# Patient Record
Sex: Male | Born: 1945 | Race: Black or African American | Hispanic: No | Marital: Single | Smoking: Former smoker
Health system: Southern US, Community
[De-identification: ages and names within clinical notes are randomized; demographics above are authoritative.]

## PROBLEM LIST (undated history)

## (undated) DIAGNOSIS — I10 Essential (primary) hypertension: Secondary | ICD-10-CM

## (undated) DIAGNOSIS — I639 Cerebral infarction, unspecified: Secondary | ICD-10-CM

## (undated) DIAGNOSIS — B159 Hepatitis A without hepatic coma: Secondary | ICD-10-CM

## (undated) DIAGNOSIS — C801 Malignant (primary) neoplasm, unspecified: Secondary | ICD-10-CM

## (undated) HISTORY — PX: HERNIA REPAIR: SHX51

---

## 2012-05-15 DIAGNOSIS — G459 Transient cerebral ischemic attack, unspecified: Secondary | ICD-10-CM

## 2012-05-23 ENCOUNTER — Inpatient Hospital Stay (HOSPITAL_COMMUNITY): Admit: 2012-05-23 | Payer: No Typology Code available for payment source | Admitting: Physical Medicine & Rehabilitation

## 2012-05-23 ENCOUNTER — Encounter: Payer: Self-pay | Admitting: *Deleted

## 2012-05-23 ENCOUNTER — Inpatient Hospital Stay (HOSPITAL_COMMUNITY)
Admission: AD | Admit: 2012-05-23 | Discharge: 2012-06-06 | DRG: 945 | Disposition: A | Discharge status: Home Health | Payer: Medicare Other | Source: Other Acute Inpatient Hospital | Attending: Physical Medicine & Rehabilitation | Admitting: Physical Medicine & Rehabilitation

## 2012-05-23 ENCOUNTER — Encounter (HOSPITAL_COMMUNITY): Payer: Self-pay | Admitting: General Practice

## 2012-05-23 DIAGNOSIS — Z5189 Encounter for other specified aftercare: Principal | ICD-10-CM

## 2012-05-23 DIAGNOSIS — Z87891 Personal history of nicotine dependence: Secondary | ICD-10-CM

## 2012-05-23 DIAGNOSIS — Z8673 Personal history of transient ischemic attack (TIA), and cerebral infarction without residual deficits: Secondary | ICD-10-CM

## 2012-05-23 DIAGNOSIS — E785 Hyperlipidemia, unspecified: Secondary | ICD-10-CM | POA: Diagnosis present

## 2012-05-23 DIAGNOSIS — I6330 Cerebral infarction due to thrombosis of unspecified cerebral artery: Secondary | ICD-10-CM | POA: Diagnosis present

## 2012-05-23 DIAGNOSIS — R059 Cough, unspecified: Secondary | ICD-10-CM | POA: Diagnosis present

## 2012-05-23 DIAGNOSIS — R05 Cough: Secondary | ICD-10-CM | POA: Diagnosis present

## 2012-05-23 DIAGNOSIS — I639 Cerebral infarction, unspecified: Secondary | ICD-10-CM

## 2012-05-23 DIAGNOSIS — L299 Pruritus, unspecified: Secondary | ICD-10-CM | POA: Diagnosis present

## 2012-05-23 DIAGNOSIS — K5900 Constipation, unspecified: Secondary | ICD-10-CM | POA: Diagnosis present

## 2012-05-23 DIAGNOSIS — I10 Essential (primary) hypertension: Secondary | ICD-10-CM

## 2012-05-23 DIAGNOSIS — G8190 Hemiplegia, unspecified affecting unspecified side: Secondary | ICD-10-CM | POA: Diagnosis present

## 2012-05-23 HISTORY — DX: Hepatitis a without hepatic coma: B15.9

## 2012-05-23 HISTORY — DX: Essential (primary) hypertension: I10

## 2012-05-23 HISTORY — DX: Cerebral infarction, unspecified: I63.9

## 2012-05-23 HISTORY — DX: Malignant (primary) neoplasm, unspecified: C80.1

## 2012-05-23 MED ORDER — CLOPIDOGREL BISULFATE 75 MG PO TABS
75.0000 mg | ORAL_TABLET | Freq: Every day | ORAL | Status: DC
  Administered 2012-05-24 – 2012-06-06 (×14): 75 mg via ORAL
  Filled 2012-05-23 (×16): qty 1

## 2012-05-23 MED ORDER — GUAIFENESIN-DM 100-10 MG/5ML PO SYRP: 5.0000 mL | ORAL_SOLUTION | Freq: Four times a day (QID) | ORAL | Status: DC | PRN

## 2012-05-23 MED ORDER — SIMVASTATIN 20 MG PO TABS
20.0000 mg | ORAL_TABLET | Freq: Every day | ORAL | Status: DC
  Administered 2012-05-23 – 2012-06-05 (×14): 20 mg via ORAL
  Filled 2012-05-23 (×15): qty 1

## 2012-05-23 MED ORDER — ENOXAPARIN SODIUM 40 MG/0.4ML ~~LOC~~ SOLN
40.0000 mg | Freq: Every day | SUBCUTANEOUS | Status: DC
  Administered 2012-05-23 – 2012-06-06 (×15): 40 mg via SUBCUTANEOUS
  Filled 2012-05-23 (×18): qty 0.4

## 2012-05-23 MED ORDER — LOSARTAN POTASSIUM 50 MG PO TABS
50.0000 mg | ORAL_TABLET | Freq: Every day | ORAL | Status: DC
  Administered 2012-05-24 – 2012-06-01 (×9): 50 mg via ORAL
  Filled 2012-05-23 (×12): qty 1

## 2012-05-23 MED ORDER — PROCHLORPERAZINE MALEATE 5 MG PO TABS
5.0000 mg | ORAL_TABLET | Freq: Four times a day (QID) | ORAL | Status: DC | PRN
  Filled 2012-05-23: qty 2

## 2012-05-23 MED ORDER — FLEET ENEMA 7-19 GM/118ML RE ENEM: 1.0000 | ENEMA | Freq: Once | RECTAL | Status: AC | PRN

## 2012-05-23 MED ORDER — POLYETHYLENE GLYCOL 3350 17 G PO PACK
17.0000 g | PACK | Freq: Every day | ORAL | Status: DC | PRN
  Administered 2012-05-24 – 2012-05-25 (×2): 17 g via ORAL
  Filled 2012-05-23 (×2): qty 1

## 2012-05-23 MED ORDER — PROCHLORPERAZINE 25 MG RE SUPP
12.5000 mg | Freq: Four times a day (QID) | RECTAL | Status: DC | PRN
  Filled 2012-05-23: qty 1

## 2012-05-23 MED ORDER — BISACODYL 10 MG RE SUPP
10.0000 mg | Freq: Every day | RECTAL | Status: DC | PRN
  Administered 2012-05-26: 10 mg via RECTAL
  Filled 2012-05-23 (×4): qty 1

## 2012-05-23 MED ORDER — TRAZODONE HCL 50 MG PO TABS: 25.0000 mg | ORAL_TABLET | Freq: Every evening | ORAL | Status: DC | PRN

## 2012-05-23 MED ORDER — ACETAMINOPHEN 325 MG PO TABS
325.0000 mg | ORAL_TABLET | ORAL | Status: DC | PRN
  Administered 2012-06-02: 650 mg via ORAL
  Filled 2012-05-23: qty 2

## 2012-05-23 MED ORDER — HYDROCHLOROTHIAZIDE 12.5 MG PO CAPS
12.5000 mg | ORAL_CAPSULE | Freq: Every day | ORAL | Status: DC
  Administered 2012-05-24 – 2012-06-06 (×14): 12.5 mg via ORAL
  Filled 2012-05-23 (×17): qty 1

## 2012-05-23 MED ORDER — PROCHLORPERAZINE EDISYLATE 5 MG/ML IJ SOLN
5.0000 mg | Freq: Four times a day (QID) | INTRAMUSCULAR | Status: DC | PRN
  Filled 2012-05-23: qty 2

## 2012-05-23 MED ORDER — DIPHENHYDRAMINE HCL 12.5 MG/5ML PO ELIX
12.5000 mg | ORAL_SOLUTION | Freq: Four times a day (QID) | ORAL | Status: DC | PRN
  Filled 2012-05-23: qty 10

## 2012-05-23 MED ORDER — ALUM & MAG HYDROXIDE-SIMETH 200-200-20 MG/5ML PO SUSP: 30.0000 mL | ORAL | Status: DC | PRN

## 2012-05-23 NOTE — H&P (Signed)
Physical Medicine and Rehabilitation Admission H&P  HPI: Dustin West is an 67 y.o. RH-male with history of HTN, TIA event 05/14/12, who was readmitted to [facility] on 05/20/12 with left sided weakness and numbness. Echo done on prior visit with EF of 60-65% with mild LVH. 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  MRA head and neck without stenosis. He reported cough due to lisinopril and was changed over to cozaar. 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 Patient continues to be impaired in ability to carry out ADLS as well as deficits in balance. Therapy team working on pregait activities and recommended intensive therapies.  Patient admitted for CIR today.     Review of Systems  HENT: Negative for hearing loss.   Eyes: Negative for blurred vision and double vision.  Respiratory: Positive for cough (dry).        Post nasal drip.  Cardiovascular: Negative for chest pain and palpitations.  Gastrointestinal: Positive for constipation. Negative for heartburn, nausea, vomiting and abdominal pain.  Genitourinary: Negative for urgency and frequency.  Musculoskeletal: Negative for myalgias, back pain and joint pain.  Skin: Positive for itching.  Neurological: Positive for sensory change, focal weakness and headaches (due to cold). Negative for speech change.  Psychiatric/Behavioral: The patient does not have insomnia.    Past Medical History  Diagnosis Date  . Hypertension     diagnosed 05/14/12  . Stroke   . Cancer   . Hepatitis A    Past Surgical History  Procedure Date  . Hernia repair     Family History  Problem Relation Age of Onset  . Cancer Father   . Cancer Brother     Social History:  Lives alone. Used to work as a Music therapist for [employer]. He  reports that she quit smoking about 15 years ago. He does not have any smokeless tobacco history on file. He has history of alcohol abuse--quit 20 years ago due "to problems with bones and liver". He does not use  any drugs.   Allergies:  Allergies  Allergen Reactions  . Lisinopril Cough    No prescriptions prior to admission    Home: Lives in 2 level duplex with 4 STE and 12 steps to bedroom.    Functional History: Independent PTA.  Functional Status:  Mobility: Min assist for sitting balance. Min assist transfers with verbal cues. Able to stand 1-2 mins with mild posterior lean     ADL: Min assist for feeding past set up. Max assist for UB/LB ADLs.  Cognition: Cognition Orientation Level: Oriented X4     Labs: Na: 137     K:3.7       Cl:97      CO2: 26     BUN:20     Cr: 1.26   TB: 1.1    Alb: 4.0 H/H:: 14.6/46.0      WBC: 5.5       Plt: 239  Blood pressure 116/69, pulse 67, temperature 97.9 F (36.6 C), temperature source Oral, resp. rate 16, height 5\' 6"  (1.676 m), weight 66.089 kg (145 lb 11.2 oz), SpO2 96.00%. Physical Exam  Nursing note and vitals reviewed. Constitutional: She is oriented to person, place, and time. She appears well-developed and well-nourished.  HENT:  Head: Normocephalic and atraumatic.  Right Ear: External ear normal.  Left Ear: External ear normal.  Nose: Nose normal.  Mouth/Throat: Oropharynx is clear and moist.  Eyes: Conjunctivae normal and EOM are normal. Pupils are equal,  round, and reactive to light.  Neck: Normal range of motion. Neck supple. No JVD present. No tracheal deviation present. No thyromegaly present.  Cardiovascular: Normal rate, regular rhythm and normal heart sounds.  Exam reveals no gallop and no friction rub.   No murmur heard. Respiratory: Breath sounds normal. She is in respiratory distress. She has no wheezes. She has no rales. She exhibits no tenderness.  GI: Soft. Bowel sounds are normal. She exhibits no distension. There is no tenderness.  Musculoskeletal: Normal range of motion. She exhibits no edema.  Lymphadenopathy:    She has no cervical adenopathy.  Neurological: She is alert and oriented to person, place,  and time.       Stuttering Speech--low volume. Follows basic commands without difficulty. Mild left facial weakness. 1/2 sensation left face, arm, leg. Left deltoid, bicep, tricep--tr to 1+, absent wrist and hand, HF 2+, KE and KF - 3, absent ADF and APF. DTR's 2+ right, trace on left. cognitvely he's intact.   Skin: Skin is warm and dry.  Psychiatric: She has a normal mood and affect. Her behavior is normal. Judgment and thought content normal.    No results found for this or any previous visit (from the past 48 hour(s)). No results found.  Assessment/Plan: 1. Functional deficits secondary  to thrombotic right pontine infarct. 2. Patient admitted to receive collaborative, interdisciplinary care between the psychiatrist, rehab nursing staff, and therapy team. 3. Patient's level of medical complexity and substantial therapy needs in context of that medical necessity cannot be provided at a lesser intensity of care. 4. Patient has experienced substantial functional loss form his/her baseline. Judging by the patient's diagnosis, physical exam, and functional history, the patient has the potential for functional progress OR the patient as displayed the ability to make functional progress which will result in measurable gains while on inpatient rehab.  These gains will be of substantial and practical use upon discharge in facilitating mobility and self-care at the household level. 5. Psychiatrist will provide 24 hour management of medical needs as well as oversight of the therapy plan/treatment and provide guidance as appropriate regarding the interaction of the two. 6. 24 hour rehab nursing will assist in the management of bowel and bladder fxn, nutrition, skin care, education, and help integrate therapy concepts, techniques,education, etc. 7. PT will assess and treat ZOX:WRUEA extremity strength, range of motion, stamina, balance, functional mobility, safety, adaptive techniques and equipment, NMR,  education, orthotic use  .  Goals are supervision to min assist. 8. OT will assess and treat for ADL's, functional mobility, safety, upper extremity strength, adaptive techniques and equipment, NMR, education, orthotics  .  Goals are: min assist .  9. SLP will assess and treat for n/a .  Goals are: n/a. 10. Case Management and Social Worker will assess and treat for psychological issues and discharge planning. 11. Team conference will be held weekly to establish goals to assess progress and todetermine barriers to discharge. 12.  Patient will receive at least 3 hours of therapy per day at least 5 days per week. 13. ELOS and Prognosis: 2wks and good. Pt is quite motivated    Medical Problem List and Plan: 1. DVT Prophylaxis/Anticoagulation: Pharmaceutical: Lovenox 2. Pain Management: N/A 3. Mood:  No signs of distress noted. Will monitor for now. LCSW to follow up for formal evaluation.  4. HTN: Recent diagnosis. Monitor with bid checks. Continue cozaar and monitor for improvement in cough. Check renal status on Monday. Education regarding BP control in  the context of stroke secondary prevention. 5. Dyslipidemia: continue zocor for management.    Ivory Broad, MD 05/23/12

## 2012-05-23 NOTE — Progress Notes (Signed)
Patient admitted from OSH. Patient is alert and oriented, VSS.  Bed in low position, call bell within reach. Patient oriented to room and unit. Safety agreement signed by patient and nurse.  Rehab specific safety plan (pink sheet) posted in room.  Patient given Health resource notebook.

## 2012-05-23 NOTE — PMR Pre-admission (Signed)
[facility] PMR Admission Coordinator Pre-Admission Assessment  Patient: Dustin West is an 67 y.o., male MRN: [id number] DOB: 01-03-1946 Height: 5\' 6"  (167.6 cm) Weight: 70.761 kg (156 lb)  Insurance Information HMO:No   PPO:       PCP:       IPA:       80/20:       OTHER:   PRIMARY: Medicare A/B      Policy#: [id number]      Subscriber: Dina Rich CM Name:        Phone#:       Fax#:   Pre-Cert#:        Employer: Retired Benefits:  Phone #:       Name: Armed forces technical officer. Date: 07/19/00     Deduct: $1216      Out of Pocket Max: none      Life Max: unlimited CIR: 100%      SNF: 100 days Outpatient: 80%     Co-Pay: 20% Home Health: 100%      Co-Pay: 80% DME: 80%     Co-Pay: 20% Providers: patient's choice  Emergency Contact Information Contact Information    Name Relation Home Work Mobile   Zigler,Conswella Daughter [phone]        Current Medical History  Patient Admitting Diagnosis: R Pons CVA with L hemiparesis  History of Present Illness:  Was seen on Christmas day and discharged with a diagnosis of hypertension and TIA.  Started experiencing weakness on the left side night of 12/30.  Woke up 12/31 with almost total paralysis of the left side.  MRI showed acute infarction on the right side of the pons.  Extensive chronic small vessel changes throughout the cerebral hemisphere.  [facility]  Past Medical History  Colon CA Hepatitis A Hypertension TIA 05/14/12 Inguinal Hernia Repair Colon surgery  Family History  Significant for colon cancer  Prior Rehab/Hospitalizations: No previous rehab admissions.   Current Medications Plavix, Lasartan, HCTZ, Pravachol  Patients Current Diet:  Cardiac diet  Precautions / Restrictions Precautions Precautions: Fall Precautions/Special Needs: Behavior Precaution Comments: Trouble with moter planning.  Impaired coordination.  Decreased endurance and balance.  Mild impulsivity.  Is  motivated and wants to go home. Restrictions Weight Bearing Restrictions: No   Prior Activity Level Community (5-7x/wk): Went out daily.  Walks to daughters house daily around the corner.  Home Assistive Devices / Equipment Home Assistive Devices/Equipment: None   Prior Functional Level Current Functional Level  Bed Mobility  Independent  Min assist   Transfers  Independent  Mod assist (Used hemi walker for transfers.)   Mobility - Walk/Wheelchair  Independent  Other (Stood for 2 minutes.  Has a posterior lean.)   Upper Body Dressing  Independent  Max assist   Lower Body Dressing  Independent  Max assist   Grooming  Independent  Min assist   Eating/Drinking  Independent  Min assist   Toilet Transfer  Independent  Max assist   Bladder Continence   WNL      Bowel Management  Tends toward constipation  Constipation   Stair Climbing  Able to Take Stairs?: Yes  Other (Not tried.)   Communication  WNL  Speech is difficult to understand   Memory  WNL  mild impairment   Cooking/Meal Prep  I      Housework  I    Money Management  I    Driving  No     Previous Home Environment  Living Arrangements: Alone Lives With: Alone Available Help at Discharge: Family Type of Home: Apartment Home Layout: Two level;1/2 bath on main level Alternate Level Stairs-Number of Steps: 12 Home Access: Stairs to enter [employer] of Steps: 4  Discharge Living Setting Plans for Discharge Living Setting: House;Lives with (comment) (Can go home with daughter.) Type of Home at Discharge: Apartment Discharge Home Layout: Two level;Able to live on main level with bedroom/bathroom Alternate Level Stairs-Number of Steps: Flight of stairs Discharge Home Access: Stairs to enter [employer] of Steps: 2 steps to porch.  Social/Family/Support Systems Patient Roles: Parent (Has 2 daughters and a son.) Contact Information: Jake Samples - dtr  (c) [phone] Anticipated Caregiver: Dtr and son Tyronn Golda - son [phone]) Anticipated Caregiver's Contact Information: Dtr, Rema Jasmine is primary contact. Ability/Limitations of Caregiver: Patient has lived with dtr previously.  Dtr can assist.  Son can also assist. Caregiver Availability: Other (Comment) (Family to work on 24 hr supervision.) Discharge Plan Discussed with Primary Caregiver: Yes Is Caregiver In Agreement with Plan?: Yes Does Caregiver/Family have Issues with Lodging/Transportation while Pt is in Rehab?: No  Goals/Additional Needs Patient/Family Goal for Rehab: PT/OT S/min A goals, ST needs TBD Expected length of stay: 10-14 days Cultural Considerations: None Dietary Needs: Cardiac diet Equipment Needs: TBD Pt/Family Agrees to Admission and willing to participate: Yes Program Orientation Provided & Reviewed with Pt/Caregiver Including Roles  & Responsibilities: Yes  Patient Condition:  I have seen and evaluated this patient.  I have shared all information with rehab MD.  I have talked with daughter as well as the patient.  Patient is very motivated and has good potential for recovery.  Patient can tolerate and benefit from a comprehensive inpatient rehab program.  Preadmission Screen Completed By:  Trish Mage, 05/23/2012 9:35 AM ______________________________________________________________________   Discussed status with Dr. Riley Kill on 05/23/12 at [phone] and received telephone approval for admission today.  Admission Coordinator:  Trish Mage, time 0955/Date 05/23/12

## 2012-05-23 NOTE — Plan of Care (Signed)
Overall Plan of Care [facility]) Patient Details Name: DIOGO ANNE MRN: [id number] DOB: 01/14/1946  Diagnosis:  CVA  Primary Diagnosis:    <principal problem not specified> Co-morbidities: htn, cancer, hepatitis A  Functional Problem List  Patient demonstrates impairments in the following areas: Balance, Medication Management, Motor, Pain, Sensory  and Skin Integrity  Basic ADL's: eating, grooming, bathing, dressing and toileting Advanced ADL's: simple meal preparation  Transfers:  bed mobility, bed to chair, toilet, tub/shower and car Locomotion:  ambulation and stairs  Additional Impairments:  Communication  expression  Anticipated Outcomes Item Anticipated Outcome  Eating/Swallowing    Basic self-care  Supervision  Tolieting  supervision  Bowel/Bladder  Continent of bowel & bladder  Transfers  S/Mod-I  Locomotion  150' w/ LRAD x 150' S/Mod-I Stairs x 12 with min-A  Communication  Mod I   Cognition    Pain  Patient will rate pain <3 on pain scale of 0-10  Safety/Judgment  Patient will use call bell to call for assistance/notify nurse. Patient will remain free from falls during admission  Other  Skin will remain intact during admission   Therapy Plan: PT Intensity: Minimum of 1-2 x/day ,45 to 90 minutes PT Duration Estimated Length of Stay: 10-14 days OT Intensity: Minumum of 2-3x/day, 45 to 90 minutes OT Frequency: 5 out of 7 days OT Duration/Estimated Length of Stay: 2 weeks SLP Intensity: Minumum of 1-2 x/day, 30 to 90 minutes SLP Frequency: 3 out of 7 days SLP Duration/Estimated Length of Stay: 1-2 weeks for SLP    Team Interventions: Item RN PT OT SLP SW TR Other  Self Care/Advanced ADL Retraining   x      Neuromuscular Re-Education  x x      Therapeutic Activities  x x x     UE/LE Strength Training/ROM  x xx      UE/LE Coordination Activities  x       Visual/Perceptual Remediation/Compensation         DME/Adaptive Equipment Instruction  x x       Therapeutic Exercise  x xxx x     Balance/Vestibular Training  x x      Patient/Family Education  x x x     Cognitive Remediation/Compensation         Functional Mobility Training  x x      Ambulation/Gait Training  x       Museum/gallery curator  x       Wheelchair Propulsion/Positioning  x       Surveyor, mining Facilitation    x     Bladder Management         Bowel Management         Disease Management/Prevention         Pain Management X        Medication Management X        Skin Care/Wound Management X        Splinting/Orthotics  x       Discharge Planning  x       Psychosocial Support X                               Team Discharge Planning: Destination: PT-Home ,OT-   , SLP-  Projected Follow-up: PT-Home health PT, OT-   , SLP-  Projected Equipment Needs: PT TBD RW, w/c and cushion- , OT-  , SLP-  Patient/family involved in discharge planning: PT- Patient,  OT- , SLP-   MD ELOS: 10-14 days Medical Rehab Prognosis:  Excellent Assessment: The patient has been admitted for CIR therapies. The team will be addressing, functional mobility, strength, stamina, balance, safety, adaptive techniques/equipment, self-care, bowel and bladder mgt, patient and caregiver education, NMR, orthotics, stroke education. Goals have been set at supervision to mod I.     See Team Conference Notes for weekly updates to the plan of care

## 2012-05-24 ENCOUNTER — Inpatient Hospital Stay (HOSPITAL_COMMUNITY): Payer: Medicare Other | Admitting: *Deleted

## 2012-05-24 ENCOUNTER — Inpatient Hospital Stay (HOSPITAL_COMMUNITY): Payer: Medicare Other | Admitting: Physical Therapy

## 2012-05-24 ENCOUNTER — Inpatient Hospital Stay (HOSPITAL_COMMUNITY): Payer: Medicare Other | Admitting: Speech Pathology

## 2012-05-24 DIAGNOSIS — I6330 Cerebral infarction due to thrombosis of unspecified cerebral artery: Secondary | ICD-10-CM

## 2012-05-24 NOTE — Evaluation (Signed)
Occupational Therapy Assessment and Plan  Patient Details  Name: Dustin West MRN: [id number] Date of Birth: 1946/03/30  OT Diagnosis: hemiplegia affecting non-dominant side Rehab Potential: Rehab Potential: Excellent ELOS: 2 weeks   Today's Date: 05/24/2012 Time:  - 0800-0900  (60 min)       Problem List: There is no problem list on file for this patient.   Past Medical History:  Past Medical History  Diagnosis Date  . Hypertension     diagnosed 05/14/12  . Stroke   . Cancer   . Hepatitis A    Past Surgical History:  Past Surgical History  Procedure Date  . Hernia repair     Assessment & Plan Clinical Impression:Dustin West is an 67 y.o. RH-male with history of HTN, TIA event 05/14/12, who was readmitted to [facility] on 05/20/12 with left sided weakness and numbness. Echo done on prior visit with EF of 60-65% with mild LVH. 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 MRA head and neck without stenosis. He reported cough due to lisinopril and was changed over to cozaar. 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 Patient continues to be impaired in ability to carry out ADLS as well as deficits in balance. Therapy team working on pregait activities and recommended intensive therapies. Patient admitted for CIR today.  Patient transferred to CIR on 05/23/2012 .    Patient currently requires min with basic self-care skills secondary to abnormal tone.  Prior to hospitalization, patient could complete BADL with no assist.  Patient will benefit from skilled intervention to increase independence with basic self-care skills and increase level of independence with iADL prior to discharge home with care partner.  Anticipate patient will require intermittent supervision and follow up home health.  OT - End of Session Activity Tolerance: Tolerates 30+ min activity with multiple rests Endurance Deficit: Yes Endurance Deficit Description: goes to bed after session OT  Assessment Rehab Potential: Excellent Barriers to Discharge: None OT Plan OT Intensity: Minumum of 2-3x/day, 45 to 90 minutes OT Frequency: 5 out of 7 days OT Duration/Estimated Length of Stay: 2 weeks OT Treatment/Interventions: Balance/vestibular training;Discharge planning;DME/adaptive equipment instruction;Functional mobility training;Neuromuscular re-education;Pain management;Patient/family education;Self Care/advanced ADL retraining;Splinting/orthotics;Therapeutic Activities;Therapeutic Exercise;UE/LE Strength taining/ROM;UE/LE Coordination activities;Wheelchair propulsion/positioning  OT Evaluation Precautions/Restrictions  Precautions Precautions: Fall Precaution Comments: left knee buckles Restrictions Weight Bearing Restrictions: No General:  Pt. Stood at sink for 3-5 minutes during pericare with minimal assist.      Pain:  none   Home Living/Prior Functioning Home Living Lives With: Alone Available Help at Discharge: Family (son will come and live with him (Dustin West)) Type of Home: Apartment Home Access: Stairs to enter Secretary/administrator of Steps: 4 Entrance Stairs-Rails: None Home Layout: Two level;1/2 bath on main level Alternate Level Stairs-Number of Steps: 12 Alternate Level Stairs-Rails:  (wall on both sides) Bathroom Shower/Tub: Tub/shower unit;Curtain Bathroom Toilet: Standard Bathroom Accessibility: Yes How Accessible: Accessible via walker IADL History Homemaking Responsibilities: Yes Meal Prep Responsibility: Primary Laundry Responsibility: Primary Cleaning Responsibility: Primary Bill Paying/Finance Responsibility: Primary Shopping Responsibility: Primary Current License: No Mode of Transportation: Family;Friends Occupation: Retired Type of Occupation:  TEFL teacher, Financial risk analyst) Leisure and Hobbies:  (crossword puzzles) Prior Function Level of Independence: Independent with basic ADLs;Independent with homemaking with ambulation;Independent with  gait;Independent with transfers Able to Take Stairs?: Yes Driving: No Vocation: Retired Leisure: Hobbies-yes (Comment) Comments: crossword puzzles ADL ADL Grooming: Minimal assistance Upper Body Bathing: Moderate assistance Where Assessed-Upper Body Bathing: Sitting at sink;Wheelchair Lower Body Bathing: Moderate  assistance Where Assessed-Lower Body Bathing: Sitting at sink;Wheelchair Upper Body Dressing: Moderate assistance Where Assessed-Upper Body Dressing: Wheelchair;Sitting at sink Lower Body Dressing: Moderate assistance Where Assessed-Lower Body Dressing: Wheelchair;Sitting at sink Toilet Transfer: Moderate assistance Vision/Perception  Vision - History Baseline Vision: Bifocals Vision - Assessment Eye Alignment: Within Functional Limits Vision Assessment: Vision not tested Perception Perception: Within Functional Limits  Cognition Overall Cognitive Status: Appears within functional limits for tasks assessed Orientation Level: Oriented X4 Executive Function: Decision Making;Sequencing;Initiating;Self Correcting Decision Making: Appears intact Initiating: Appears intact Self Correcting: Appears intact Safety/Judgment: Appears intact Sensation Sensation Light Touch: Appears Intact (BUE) Coordination Gross Motor Movements are Fluid and Coordinated: No Fine Motor Movements are Fluid and Coordinated: No Coordination and Movement Description:  (LUE hemiplegis with trace movement) Motor  Motor Motor: Hemiplegia;Abnormal postural alignment and control;Abnormal tone;Primitive reflexes present (:LUE with associated reactions) Motor - Skilled Clinical Observations:  (trace extension in L elbow and sho.) Mobility  Bed Mobility Bed Mobility: Rolling Left;Left Sidelying to Sit;Supine to Sit;Sit to Supine Rolling Right: 4: Min assist Rolling Left: 4: Min assist Rolling Left Details: Tactile cues for weight shifting;Tactile cues for placement;Manual facilitation for weight  shifting Right Sidelying to Sit: 4: Min assist Left Sidelying to Sit: 4: Min assist Left Sidelying to Sit Details: Tactile cues for weight shifting;Verbal cues for technique;Manual facilitation for placement Supine to Sit: 4: Min assist Supine to Sit Details: Tactile cues for weight shifting;Verbal cues for technique;Manual facilitation for weight shifting Sit to Supine: 4: Min assist Sit to Supine - Details: Verbal cues for technique Scooting to HOB: 4: Min assist Transfers Sit to Stand: 4: Min assist Stand to Sit: 4: Min assist  Trunk/Postural Assessment  Cervical Assessment Cervical Assessment: Within Functional Limits Thoracic Assessment Thoracic Assessment: Within Functional Limits Lumbar Assessment Lumbar Assessment: Within Functional Limits Postural Control Postural Control: Deficits on evaluation Trunk Control: impaired muscle tone on left  Balance Balance Balance Assessed: Yes Dynamic Sitting Balance Dynamic Sitting - Level of Assistance: 4: Min assist Dynamic Sitting - Balance Activities: Reaching across midline;Reaching for objects;Forward lean/weight shifting;Lateral lean/weight shifting Static Standing Balance Static Standing - Level of Assistance: 5: Stand by assistance (With Right hand on hemiwalker and eyes open) Dynamic Standing Balance Dynamic Standing - Level of Assistance: 1: +1 Total assist (Left knee buckles during gait.Potential for fall w/ > tasks) Extremity/Trunk Assessment RUE Assessment RUE Assessment: Within Functional Limits LUE Assessment LUE Assessment: Exceptions to WFL LUE Tone LUE Tone: Hypotonic Hypotonic Details:  (trace extenxion in sho. and elbow)  See FIM for current functional status Refer to Care Plan for Long Term Goals  Recommendations for other services: None  Discharge Criteria: Patient will be discharged from OT if patient refuses treatment 3 consecutive times without medical reason, if treatment goals not met, if there is a  change in medical status, if patient makes no progress towards goals or if patient is discharged from hospital.  The above assessment, treatment plan, treatment alternatives and goals were discussed and mutually agreed upon: by patient  Humberto Seals 05/24/2012, 6:20 PM

## 2012-05-24 NOTE — Progress Notes (Signed)
No new complaints  HPI: Dustin West is an 67 y.o. RH-male with history of HTN, TIA event 05/14/12, who was readmitted to [facility] on 05/20/12 with left sided weakness and numbness. Echo done on prior visit with EF of 60-65% with mild LVH. 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 MRA head and neck without stenosis. He reported cough due to lisinopril and was changed over to cozaar. 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 Patient continues to be impaired in ability to carry out ADLS as well as deficits in balance. Therapy team working on pregait activities and recommended intensive therapies. Patient admitted for CIR today.  Review of Systems  HENT: Negative for hearing loss.  Eyes: Negative for blurred vision and double vision.  Respiratory: Positive for cough (dry).  Post nasal drip.  Cardiovascular: Negative for chest pain and palpitations.  Gastrointestinal: Positive for constipation. Negative for heartburn, nausea, vomiting and abdominal pain.  Genitourinary: Negative for urgency and frequency.  Musculoskeletal: Negative for myalgias, back pain and joint pain.  Skin: Positive for itching.  Neurological: Positive for sensory change, focal weakness and headaches (due to cold). Negative for speech change.  Psychiatric/Behavioral: The patient does not have insomnia.  Past Medical History   Diagnosis  Date   .  Hypertension      diagnosed 05/14/12   .  Stroke    .  Cancer    .  Hepatitis A     Past Surgical History   Procedure  Date   .  Hernia repair     Family History   Problem  Relation  Age of Onset   .  Cancer  Father    .  Cancer  Brother    Social History: Lives alone. Used to work as a Music therapist for [employer]. He reports that she quit smoking about 15 years ago. He does not have any smokeless tobacco history on file. He has history of alcohol abuse--quit 20 years ago due "to problems with bones and liver". He does not use any drugs.  Allergies:    Allergies   Allergen  Reactions   .  Lisinopril  Cough    No prescriptions prior to admission   Home:  Lives in 2 level duplex with 4 STE and 12 steps to bedroom.  Functional History:  Independent PTA.  Functional Status:  Mobility:  Min assist for sitting balance.  Min assist transfers with verbal cues. Able to stand 1-2 mins with mild posterior lean  ADL:  Min assist for feeding past set up.  Max assist for UB/LB ADLs.  Cognition:  Cognition  Orientation Level: Oriented X4  Labs:  Na: 137 K:3.7 Cl:97 CO2: 26 BUN:20 Cr: 1.26 TB: 1.1 Alb: 4.0  H/H:: 14.6/46.0 WBC: 5.5 Plt: 239   BP 127/58  Pulse 68  Temp 98.2 F (36.8 C) (Oral)  Resp 19  Ht 5\' 6"  (1.676 m)  Wt 145 lb 11.2 oz (66.089 kg)  BMI 23.52 kg/m2  SpO2 96%  Physical Exam  Nursing note and vitals reviewed.  Constitutional: She is oriented to person, place, and time. She appears well-developed and well-nourished. NAD HENT: moist Head: Normocephalic and atraumatic.  Right Ear: External ear normal.  Left Ear: External ear normal.  Nose: Nose normal.  Mouth/Throat: Oropharynx is clear and moist.  Eyes: Conjunctivae normal and EOM are normal. Pupils are equal, round, and reactive to light.  Neck: Normal range of motion. Neck supple. No JVD present. No tracheal deviation  present. No thyromegaly present.  Cardiovascular: Normal rate, regular rhythm and normal heart sounds. Exam reveals no gallop and no friction rub.  No murmur heard.  Respiratory: Breath sounds normal. She is in respiratory distress. She has no wheezes. She has no rales. She exhibits no tenderness.  GI: Soft. Bowel sounds are normal. She exhibits no distension. There is no tenderness.  Musculoskeletal: Normal range of motion. She exhibits no edema.  Lymphadenopathy:  She has no cervical adenopathy.  Neurological: She is alert and oriented to person, place, and time.  Stuttering Speech--low volume. Follows basic commands without difficulty. Mild  left facial weakness. 1/2 sensation left face, arm, leg. Left deltoid, bicep, tricep--tr to 1+, absent wrist and hand, HF 2+, KE and KF - 3, absent ADF and APF. DTR's 2+ right, trace on left. cognitvely he's intact.  Skin: Skin is warm and dry.  Psychiatric: She has a normal mood and affect. Her behavior is normal. Judgment and thought content normal.  No results found for this or any previous visit (from the past 48 hour(s)).  No results found.  Assessment/Plan:  1. Functional deficits secondary to thrombotic right pontine infarct. 2. Patient admitted to receive collaborative, interdisciplinary care between the psychiatrist, rehab nursing staff, and therapy team. 3. Patient's level of medical complexity and substantial therapy needs in context of that medical necessity cannot be provided at a lesser intensity of care. 4. Patient has experienced substantial functional loss form his/her baseline. Judging by the patient's diagnosis, physical exam, and functional history, the patient has the potential for functional progress OR the patient as displayed the ability to make functional progress which will result in measurable gains while on inpatient rehab. These gains will be of substantial and practical use upon discharge in facilitating mobility and self-care at the household level. 5. Psychiatrist will provide 24 hour management of medical needs as well as oversight of the therapy plan/treatment and provide guidance as appropriate regarding the interaction of the two. 6. 24 hour rehab nursing will assist in the management of bowel and bladder fxn, nutrition, skin care, education, and help integrate therapy concepts, techniques,education, etc. 7. PT will assess and treat ZOX:WRUEA extremity strength, range of motion, stamina, balance, functional mobility, safety, adaptive techniques and equipment, NMR, education, orthotic use . Goals are supervision to min assist. 8. OT will assess and treat for ADL's,  functional mobility, safety, upper extremity strength, adaptive techniques and equipment, NMR, education, orthotics . Goals are: min assist .  9. SLP will assess and treat for n/a . Goals are: n/a. 10. Case Management and Social Worker will assess and treat for psychological issues and discharge planning. 11. Team conference will be held weekly to establish goals to assess progress and todetermine barriers to discharge. 12. Patient will receive at least 3 hours of therapy per day at least 5 days per week. 13. ELOS and Prognosis: 2wks and good. Pt is quite motivated Medical Problem List and Plan:  1. DVT Prophylaxis/Anticoagulation: Pharmaceutical: Lovenox  2. Pain Management: N/A  3. Mood: No signs of distress noted. Will monitor for now. LCSW to follow up for formal evaluation.  4. HTN: Recent diagnosis. Monitor with bid checks. Continue cozaar and monitor for improvement in cough. Check renal status on Monday. Education regarding BP control in the context of stroke secondary prevention.  5. Dyslipidemia: continue zocor for management.  6. CVA - on Rx

## 2012-05-24 NOTE — Evaluation (Signed)
Speech Language Pathology Assessment and Plan  Patient Details  Name: Dustin West MRN: [id number] Date of Birth: August 16, 1945  SLP Diagnosis: Dysarthria  Rehab Potential: Excellent ELOS: 1-2 weeks for SLP   Today's Date: 05/24/2012 Time: 1010-1050 Time Calculation (min): 40 min  Problem List: There is no problem list on file for this patient.  Past Medical History:  Past Medical History  Diagnosis Date  . Hypertension     diagnosed 05/14/12  . Stroke   . Cancer   . Hepatitis A    Past Surgical History:  Past Surgical History  Procedure Date  . Hernia repair     Assessment / Plan / Recommendation Clinical Impression  Dustin West is a 67 y.o. right-handed male with history of HTN and TIA event 05/14/12.  Patient was readmitted to [facility] on 05/20/12 with left-sided weakness and numbness. Echo done on prior visit with EF of 60-65% with mild LVH. 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 MRA head and neck without stenosis. He reported cough due to lisinopril and was changed over to cozaar. 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 Patient continues to be impaired in ability to carry out ADLS as well as deficits in balance. Therapy team working on pregait activities and recommended intensive therapies. Patient admitted to [facility] 05/23/12 and upon evaluation today presents with mild-moderate dysarthria, characterized by left-sided labial and lingual weakness and suspected palatal weakness resulting in poor fitting dentures, imprecise consonant productions.  This new weakness is compounded by a premorbid fast rate of speech and decreased respiration impacting patient's speech intelligibility.  As a result, patient would benefit from skilled SLP services to maximize functional communication prior to discharge home with son.   SLP facilitated session by verbally educating patient regarding oral motor and diaphragmatic breathing exercises as well as  introducing speech intelligibility compensatory strategies; handout provided.     SLP Assessment  Patient will need skilled Speech Lanaguage Pathology Services during CIR admission    Recommendations  Patient destination: Home Follow up Recommendations: None;24 hour supervision/assistance Equipment Recommended: None recommended by SLP    SLP Frequency 3 out of 7 days   SLP Treatment/Interventions Cueing hierarchy;Environmental controls;Functional tasks;Internal/external aids;Oral motor exercises;Patient/family education;Speech/Language facilitation;Therapeutic Activities;Therapeutic Exercise    Pain Pain Assessment Pain Assessment: No/denies pain Prior Functioning Cognitive/Linguistic Baseline: Within functional limits Type of Home: Apartment Lives With: Alone Available Help at Discharge: Family (son will come and live with him (Renaldo)) Vocation: Retired  Teacher, music Term Goals: Week 1: SLP Short Term Goal 1 (Week 1): Patient will perform diaphragmatic breathing exercises with supervision level veral cues SLP Short Term Goal 2 (Week 1): Patient will perform oral motor exercises with supervision level veral cues SLP Short Term Goal 3 (Week 1): Patient will identify 2 compensatory strategies that improve speech intelligibility SLP Short Term Goal 4 (Week 1): Patient will utilize speech intelligibility strategies during structured activities at the sentence-conversational level with min assist verbal/non-verbal cues  See FIM for current functional status Refer to Care Plan for Long Term Goals  Recommendations for other services: None  Discharge Criteria: Patient will be discharged from SLP if patient refuses treatment 3 consecutive times without medical reason, if treatment goals not met, if there is a change in medical status, if patient makes no progress towards goals or if patient is discharged from hospital.  The above assessment, treatment plan, treatment alternatives and goals were  discussed and mutually agreed upon: by patient  Fae Pippin,  M.A., CCC-SLP [phone]  Ameshia Pewitt 05/24/2012, 11:22 AM

## 2012-05-24 NOTE — Evaluation (Signed)
Physical Therapy Assessment and Plan  Patient Details  Name: Dustin West MRN: [id number] Date of Birth: 1945/11/16  PT Diagnosis: Abnormal posture, Abnormality of gait and Hemiparesis non-dominant Rehab Potential: Good ELOS: 10-14 days   Today's Date: 05/24/2012 Time: 1300-1400 Time Calculation (min): 60 min  Problem List: There is no problem list on file for this patient.   Past Medical History:  Past Medical History  Diagnosis Date  . Hypertension     diagnosed 05/14/12  . Stroke   . Cancer   . Hepatitis A    Past Surgical History:  Past Surgical History  Procedure Date  . Hernia repair     Assessment & Plan Clinical Impression: Dustin West is an 67 y.o. RH-male with history of HTN, TIA event 05/14/12, who was readmitted to [facility] on 05/20/12 with left sided weakness and numbness. Echo done on prior visit with EF of 60-65% with mild LVH. 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 MRA head and neck without stenosis. He reported cough due to lisinopril and was changed over to cozaar. 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 Patient continues to be impaired in ability to carry out ADLS as well as deficits in balance. Therapy team working on pregait activities and recommended intensive therapies.    Patient transferred to CIR on 05/23/2012 .   Patient currently requires min with mobility secondary to abnormal tone, unbalanced muscle activation and decreased coordination.  Prior to hospitalization, patient was independent  with mobility and lived with Alone in a Apartment home.  Home access is 4Stairs to enter.  Patient will benefit from skilled PT intervention to maximize safe functional mobility, minimize fall risk and decrease caregiver burden for planned discharge home with 24 hour assist.  Anticipate patient will benefit from follow up Dustin West at discharge.  PT Assessment Barriers to Discharge: None  PT  Evaluation Precautions/Restrictions Precautions Precautions: Fall Restrictions Weight Bearing Restrictions: No General Chart Reviewed: Yes Family/Caregiver Present: No Vital SignsTherapy Vitals Temp: 98.5 F (36.9 C) Temp src: Oral Pulse Rate: 75  Resp: 18  BP: 108/67 mmHg Patient Position, if appropriate: Lying Oxygen Therapy SpO2: 96 % O2 Device: None (Room air) Pain Pain Assessment Pain Assessment: No/denies pain Home Living/Prior Functioning Home Living Lives With: Alone Available Help at Discharge: Family (son Chartered certified accountant) will come stay with him at d/c) Type of Home: Apartment Home Access: Stairs to enter Entrance Stairs-Number of Steps: 4 Entrance Stairs-Rails: None Home Layout: Two level (1/2 bath on main, full bath upstairs) Alternate Level Stairs-Number of Steps: 12 Alternate Level Stairs-Rails: None Bathroom Shower/Tub: Engineer, manufacturing systems: Standard Bathroom Accessibility: Yes How Accessible: Accessible via walker Prior Function Level of Independence: Independent with basic ADLs;Independent with homemaking with ambulation;Independent with gait;Independent with transfers Able to Take Stairs?: Yes Vocation: Retired Leisure: Hobbies-yes (Comment) Comments: crossword puzzles Vision/Perception  Vision - History Baseline Vision: Bifocals Patient Visual Report: No change from baseline  Cognition Overall Cognitive Status: Appears within functional limits for tasks assessed Arousal/Alertness: Awake/alert Orientation Level: Oriented X4 Attention:  (requested SLP to reduce distractions as needed) Memory: Appears intact (used aids as needed) Awareness: Appears intact Safety/Judgment: Appears intact Sensation Sensation Light Touch: Appears Intact Coordination Heel Shin Test: unable to perform with L LE due to L hemiplegia Motor  Motor Motor: Hemiplegia;Abnormal postural alignment and control;Primitive reflexes present  Mobility Bed Mobility Bed  Mobility: Rolling Right;Right Sidelying to Sit;Sitting - Scoot to Edge of Bed;Sit to Sidelying Right;Scooting to [facility] Rolling Right:  4: Min assist Right Sidelying to Sit: 4: Min assist Sit to Supine: 4: Min assist Scooting to [facility]: 4: Min assist Transfers Sit to Stand: 4: Min assist Stand to Sit: 4: Min assist Locomotion  Ambulation Ambulation: Yes Ambulation/Gait Assistance: 4: Min assist Ambulation Distance (Feet): 80 Feet Assistive device: Hemi-walker Ambulation/Gait Assistance Details: Verbal cues for technique;Manual facilitation for weight shifting Gait Gait: Yes Gait Pattern: Impaired Gait Pattern: Step-to pattern;Decreased step length - left (Left knee buckles periodically during gait) Stairs / Additional Locomotion Stairs: No Ramp: Not tested (comment) Wheelchair Mobility Wheelchair Mobility: No  Trunk/Postural Assessment  Cervical Assessment Cervical Assessment: Within Functional Limits Thoracic Assessment Thoracic Assessment: Within Functional Limits Lumbar Assessment Lumbar Assessment: Exceptions to [facility] (decreased control of Left lower lumbar/pelvis ) Postural Control Postural Control: Deficits on evaluation Trunk Control: impaired muscle tone on Left  Balance Balance Balance Assessed: Yes Dynamic Sitting Balance Dynamic Sitting - Level of Assistance: 4: Min assist Static Standing Balance Static Standing - Level of Assistance: 5: Stand by assistance (With Right hand on hemiwalker and eyes open) Dynamic Standing Balance Dynamic Standing - Level of Assistance: 1: +1 Total assist (Left knee buckles during gait.Potential for fall w/ > tasks) Extremity Assessment  RLE Assessment RLE Assessment: Within Functional Limits LLE Assessment LLE Assessment: Exceptions to WFL (L LE grossly 3-/5 )  FIM:  FIM - Locomotion: Ambulation Ambulation/Gait Assistance: 4: Min assist   Refer to Care Plan for Long Term Goals  Recommendations for other services:  None  Discharge Criteria: Patient will be discharged from PT if patient refuses treatment 3 consecutive times without medical reason, if treatment goals not met, if there is a change in medical status, if patient makes no progress towards goals or if patient is discharged from hospital.  The above assessment, treatment plan, treatment alternatives and goals were discussed and mutually agreed upon: by patient  Rex Kras 05/24/2012, 5:37 PM

## 2012-05-25 ENCOUNTER — Encounter (HOSPITAL_COMMUNITY): Payer: No Typology Code available for payment source | Admitting: Occupational Therapy

## 2012-05-25 NOTE — Progress Notes (Signed)
Occupational Therapy Session Note  Patient Details  Name: Dustin West MRN: [id number] Date of Birth: 11-27-1945  Today's Date: 05/25/2012 Time:1100-12:15 Total time =75 minutes   Skilled Therapeutic Interventions/Progress Updates: Shower in tub/shower in ADL apt and toileting for FIM scoring.  Patient has tub/shower combo at home.   Focus on L LE and UE use and weightbearing for proprioceptive feedbackand sensation during bathing and dressing and balance.   Knee buckled continuously during standing activities today.  Patient stated she has no feeling in his left leg.    Therapy Documentation Precautions:  Precautions Precautions: Fall Precaution Comments: left knee buckles Restrictions Weight Bearing Restrictions: No  Pain: denied   See FIM for current functional status  Therapy/Group: Individual Therapy  [name] [facility] 05/25/2012, 6:06 PM

## 2012-05-25 NOTE — Progress Notes (Signed)
No new complaints this am  HPI: Dustin West is an 67 y.o. RH-male with history of HTN, TIA event 05/14/12, who was readmitted to [facility] on 05/20/12 with left sided weakness and numbness. Echo done on prior visit with EF of 60-65% with mild LVH. 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 MRA head and neck without stenosis. He reported cough due to lisinopril and was changed over to cozaar. 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 Patient continues to be impaired in ability to carry out ADLS as well as deficits in balance. Therapy team working on pregait activities and recommended intensive therapies. Patient admitted for CIR today.  Review of Systems  HENT: Negative for hearing loss.  Eyes: Negative for blurred vision and double vision.  Respiratory: Positive for cough (dry).  Post nasal drip.  Cardiovascular: Negative for chest pain and palpitations.  Gastrointestinal: Positive for constipation. Negative for heartburn, nausea, vomiting and abdominal pain.  Genitourinary: Negative for urgency and frequency.  Musculoskeletal: Negative for myalgias, back pain and joint pain.  Skin: Positive for itching.  Neurological: Positive for sensory change, focal weakness and headaches (due to cold). Negative for speech change.  Psychiatric/Behavioral: The patient does not have insomnia.  Past Medical History   Diagnosis  Date   .  Hypertension      diagnosed 05/14/12   .  Stroke    .  Cancer    .  Hepatitis A     Past Surgical History   Procedure  Date   .  Hernia repair     Family History   Problem  Relation  Age of Onset   .  Cancer  Father    .  Cancer  Brother    Social History: Lives alone. Used to work as a Music therapist for [employer]. He reports that she quit smoking about 15 years ago. He does not have any smokeless tobacco history on file. He has history of alcohol abuse--quit 20 years ago due "to problems with bones and liver". He does not use any drugs.  Allergies:   Allergies   Allergen  Reactions   .  Lisinopril  Cough    No prescriptions prior to admission   Home:  Lives in 2 level duplex with 4 STE and 12 steps to bedroom.  Functional History:  Independent PTA.  Functional Status:  Mobility:  Min assist for sitting balance.  Min assist transfers with verbal cues. Able to stand 1-2 mins with mild posterior lean  ADL:  Min assist for feeding past set up.  Max assist for UB/LB ADLs.  Cognition:  Cognition  Orientation Level: Oriented X4  Labs:  Na: 137 K:3.7 Cl:97 CO2: 26 BUN:20 Cr: 1.26 TB: 1.1 Alb: 4.0  H/H:: 14.6/46.0 WBC: 5.5 Plt: 239   BP 113/64  Pulse 67  Temp 98.3 F (36.8 C) (Oral)  Resp 18  Ht 5\' 6"  (1.676 m)  Wt 145 lb 11.2 oz (66.089 kg)  BMI 23.52 kg/m2  SpO2 95%  Physical Exam  Nursing note and vitals reviewed.  Constitutional: She is oriented to person, place, and time. She appears well-developed and well-nourished. NAD. He is eating breakfast HENT: moist Head: Normocephalic and atraumatic.  Right Ear: External ear normal.  Left Ear: External ear normal.  Nose: Nose normal.  Mouth/Throat: Oropharynx is clear and moist.  Eyes: Conjunctivae normal and EOM are normal. Pupils are equal, round, and reactive to light.  Neck: Normal range of motion. Neck supple. No  JVD present. No tracheal deviation present. No thyromegaly present.  Cardiovascular: Normal rate, regular rhythm and normal heart sounds. Exam reveals no gallop and no friction rub.  No murmur heard.  Respiratory: Breath sounds normal. She is in respiratory distress. She has no wheezes. She has no rales. She exhibits no tenderness.  GI: Soft. Bowel sounds are normal. She exhibits no distension. There is no tenderness.  Musculoskeletal: Normal range of motion. She exhibits no edema.  Lymphadenopathy:  She has no cervical adenopathy.  Neurological: She is alert and oriented to person, place, and time.  Stuttering Speech--low volume. Follows basic commands  without difficulty. Mild left facial weakness. 1/2 sensation left face, arm, leg. Left deltoid, bicep, tricep--tr to 1+, absent wrist and hand, HF 2+, KE and KF - 3, absent ADF and APF. DTR's 2+ right, trace on left. cognitvely he's intact.  Skin: Skin is warm and dry.  Psychiatric: She has a normal mood and affect. Her behavior is normal. Judgment and thought content normal.  No results found for this or any previous visit (from the past 48 hour(s)).  No results found.  Assessment/Plan:  1. Functional deficits secondary to thrombotic right pontine infarct. 2. Patient admitted to receive collaborative, interdisciplinary care between the psychiatrist, rehab nursing staff, and therapy team. 3. Patient's level of medical complexity and substantial therapy needs in context of that medical necessity cannot be provided at a lesser intensity of care. 4. Patient has experienced substantial functional loss form his/her baseline. Judging by the patient's diagnosis, physical exam, and functional history, the patient has the potential for functional progress OR the patient as displayed the ability to make functional progress which will result in measurable gains while on inpatient rehab. These gains will be of substantial and practical use upon discharge in facilitating mobility and self-care at the household level. 5. Psychiatrist will provide 24 hour management of medical needs as well as oversight of the therapy plan/treatment and provide guidance as appropriate regarding the interaction of the two. 6. 24 hour rehab nursing will assist in the management of bowel and bladder fxn, nutrition, skin care, education, and help integrate therapy concepts, techniques,education, etc. 7. PT will assess and treat NFA:OZHYQ extremity strength, range of motion, stamina, balance, functional mobility, safety, adaptive techniques and equipment, NMR, education, orthotic use . Goals are supervision to min assist. 8. OT will assess  and treat for ADL's, functional mobility, safety, upper extremity strength, adaptive techniques and equipment, NMR, education, orthotics . Goals are: min assist .  9. SLP will assess and treat for n/a . Goals are: n/a. 10. Case Management and Social Worker will assess and treat for psychological issues and discharge planning. 11. Team conference will be held weekly to establish goals to assess progress and todetermine barriers to discharge. 12. Patient will receive at least 3 hours of therapy per day at least 5 days per week. 13. ELOS and Prognosis: 2wks and good. Pt is quite motivated Medical Problem List and Plan:  1. DVT Prophylaxis/Anticoagulation: Pharmaceutical: Lovenox  2. Pain Management: N/A  3. Mood: No signs of distress noted. Will monitor for now. LCSW to follow up for formal evaluation.  4. HTN: Recent diagnosis. Monitor with bid checks. Continue cozaar and monitor for improvement in cough. Check renal status on Monday. Education regarding BP control in the context of stroke secondary prevention. Cont Rx 5. Dyslipidemia: continue zocor for management.  6. CVA - on Rx

## 2012-05-26 ENCOUNTER — Inpatient Hospital Stay (HOSPITAL_COMMUNITY): Payer: Medicare Other

## 2012-05-26 ENCOUNTER — Inpatient Hospital Stay (HOSPITAL_COMMUNITY): Payer: Medicare Other | Admitting: Speech Pathology

## 2012-05-26 DIAGNOSIS — I10 Essential (primary) hypertension: Secondary | ICD-10-CM

## 2012-05-26 DIAGNOSIS — I639 Cerebral infarction, unspecified: Secondary | ICD-10-CM

## 2012-05-26 DIAGNOSIS — I6330 Cerebral infarction due to thrombosis of unspecified cerebral artery: Secondary | ICD-10-CM

## 2012-05-26 LAB — CBC WITH DIFFERENTIAL/PLATELET
Basophils Absolute: 0 10*3/uL (ref 0.0–0.1)
Basophils Relative: 0 % (ref 0–1)
Eosinophils Absolute: 0 10*3/uL (ref 0.0–0.7)
Hemoglobin: 14.2 g/dL (ref 13.0–17.0)
MCH: 28.9 pg (ref 26.0–34.0)
MCHC: 33.3 g/dL (ref 30.0–36.0)
Monocytes Relative: 10 % (ref 3–12)
Neutro Abs: 2 10*3/uL (ref 1.7–7.7)
Neutrophils Relative %: 40 % — ABNORMAL LOW (ref 43–77)
Platelets: 219 10*3/uL (ref 150–400)
RBC: 4.92 MIL/uL (ref 4.22–5.81)

## 2012-05-26 LAB — COMPREHENSIVE METABOLIC PANEL
ALT: 10 U/L (ref 0–53)
AST: 16 U/L (ref 0–37)
CO2: 28 mEq/L (ref 19–32)
Calcium: 9.6 mg/dL (ref 8.4–10.5)
Chloride: 97 mEq/L (ref 96–112)
Creatinine, Ser: 1.07 mg/dL (ref 0.50–1.35)
GFR calc Af Amer: 82 mL/min — ABNORMAL LOW (ref >90)
GFR calc non Af Amer: 70 mL/min — ABNORMAL LOW (ref >90)
Glucose, Bld: 101 mg/dL — ABNORMAL HIGH (ref 70–99)
Total Bilirubin: 0.3 mg/dL (ref 0.3–1.2)

## 2012-05-26 NOTE — Progress Notes (Signed)
Physical Therapy Session Note  Patient Details  Name: Dustin West MRN: [id number] Date of Birth: September 14, 1945  Today's Date: 05/26/2012 Time: 1107-1202 Time Calculation (min): 55 min  Short Term Goals: Week 1:  PT Short Term Goal 1 (Week 1): Patient will be able to perform bed mobility with S/Mod-I PT Short Term Goal 2 (Week 1): Patient will be able to perform transfers with S/Mod-I PT Short Term Goal 3 (Week 1): Patient will ambulate using LRAD x 150' in controlled environmnet with S/Mod-I PT Short Term Goal 4 (Week 1): Patient will ascend/descend 4 steps with hand rail with Mod-A   Skilled Therapeutic Interventions/Progress Updates:  neuromuscular re-education via demo, forced use, tactile and manual cues for:  - trunk shortening/lengthening with wt shifting, feet unsupported during RUE activities out of BOS in all directions -swing phase components during L foot taps onto target on 4" high stool -standing-  LLE hip abduction, knee flexion, hip flexion -repetitive sit>< stand working on anterior pelvic tilt  -gait with HHA x 40' x 2, mod assist for L knee control which tends to hyperextend, wt shifting, upright trunk    Therapy Documentation Precautions:  Precautions Precautions: Fall Precaution Comments: left knee buckles Restrictions Weight Bearing Restrictions: No   Pain: Pain Assessment Pain Assessment: No/denies pain   Locomotion : Ambulation Ambulation/Gait Assistance: 3: Mod assist   See FIM for current functional status  Therapy/Group: Individual Therapy  Davide Risdon 05/26/2012, 1:20 PM

## 2012-05-26 NOTE — Care Management Note (Signed)
Inpatient Rehabilitation Center Individual Statement of Services  Patient Name:  Dustin West  Date:  05/26/2012  Welcome to the Inpatient Rehabilitation Center.  Our goal is to provide you with an individualized program based on your diagnosis and situation, designed to meet your specific needs.  With this comprehensive rehabilitation program, you will be expected to participate in at least 3 hours of rehabilitation therapies Monday-Friday, with modified therapy programming on the weekends.  Your rehabilitation program will include the following services:  Physical Therapy (PT), Occupational Therapy (OT), Speech Therapy (ST), 24 hour per day rehabilitation nursing, Therapeutic Recreaction (TR), Case Management (RN and Child psychotherapist), Rehabilitation Medicine, Nutrition Services and Pharmacy Services  Weekly team conferences will be held on Wednesday to discuss your progress.  Your RN Case Designer, television/film set will talk with you frequently to get your input and to update you on team discussions.  Team conferences with you and your family in attendance may also be held.  Expected length of stay: 10-14 days Overall anticipated outcome: supervision/mod/i level  Depending on your progress and recovery, your program may change.  Your RN Case Estate agent will coordinate services and will keep you informed of any changes.  Your RN Sports coach and SW names and contact numbers are listed  below.  The following services may also be recommended but are not provided by the Inpatient Rehabilitation Center:   Driving Evaluations  Home Health Rehabiltiation Services  Outpatient Rehabilitatation [facility]  Vocational Rehabilitation   Arrangements will be made to provide these services after discharge if needed.  Arrangements include referral to agencies that provide these services.  Your insurance has been verified to be:  Medicare & [employer] Your primary doctor is:  Dr Kirstie Peri  Pertinent information will be shared with your doctor and your insurance company.   Social Worker:  Dossie Der, [place] [phone]  Information discussed with and copy given to patient by: Lucy Chris, 05/26/2012, 10:19 AM

## 2012-05-26 NOTE — Progress Notes (Signed)
Social Work Assessment and Plan Social Work Assessment and Plan  Patient Details  Name: Dustin West MRN: [id number] Date of Birth: 1946/05/10  Today's Date: 05/26/2012  Problem List:  Patient Active Problem List  Diagnosis  . CVA (cerebral infarction)  . HTN (hypertension)   Past Medical History:  Past Medical History  Diagnosis Date  . Hypertension     diagnosed 05/14/12  . Stroke   . Cancer   . Hepatitis A    Past Surgical History:  Past Surgical History  Procedure Date  . Hernia repair    Social History:  reports that he quit smoking about 15 years ago. He does not have any smokeless tobacco history on file. His alcohol and drug histories not on file.  Family / Support Systems Marital Status: Single Patient Roles: Parent Children: Systems developer  [phone] Other Supports: Renaldo Bilodeau-son  [phone] Anticipated Caregiver: daughter and son Ability/Limitations of Caregiver: Son to move in with pt at discharge to provide supervision Caregiver Availability: 24/7 Family Dynamics: Pt reports: " My children are very supportive of me."  He was living with his daughter unttil he obtained his own aprartment.  He is very close with his children.  Social History Preferred language: Unknown Religion: Unknown Cultural Background: No issues Education: High School Read: Yes Write: Yes Employment Status: Retired Fish farm manager Issues: No issues Guardian/Conservator: None-according to MD pt is capabel of making own decisions   Abuse/Neglect Physical Abuse: Denies Verbal Abuse: Denies Sexual Abuse: Denies Exploitation of patient/patient's resources: Denies Self-Neglect: Denies  Emotional Status Pt's affect, behavior adn adjustment status: Pt is motivated to imporve and eocme as independent as possible.  He is very self sufficient and has always doen for himsefl and wants to continue to do this. Recent Psychosocial Issues: Other medical  issues Pyschiatric History: No history-deferrede depression screen due to pt doing well and coping appropriately.  Will continue to monitor hsi coping while here. Substance Abuse History: No issues  Patient / Family Perceptions, Expectations & Goals Pt/Family understanding of illness & functional limitations: Pt and daughter can explain his stroke and deficits.  He is hopeful he will do well here and is encouraged by the progress he has made already.  Daughter reports: " We will do whatever is needed for him." Premorbid pt/family roles/activities: Father, grandfather, Retiree, etc Anticipated changes in roles/activities/participation: resume Pt/family expectations/goals: Pt states: " I want to be as independent as possible."  Daughter states: " We will do what he needs at home, my brother will be there with him."  [employer]: None Premorbid Home Care/DME Agencies: None Transportation available at discharge: [employer] referrals recommended: Support group (specify) (CVA Support group)  Discharge Planning Living Arrangements: Alone Support Systems: Children;Friends/neighbors;Church/faith community Type of Residence: Private residence Insurance Resources: Administrator (specify) ([employer]) Surveyor, quantity Resources: Restaurant manager, fast food Screen Referred: No Living Expenses: Rent Money Management: Patient;Family Do you have any problems obtaining your medications?: No Home Management: Self and daughter Patient/Family Preliminary Plans: Return home with son moving in with him for a short time.  He can also go to his daughter's hoem is necessary.  Children are very supportive of him and watch out for him. Social Work Anticipated Follow Up Needs: HH/OP;Support Group  Clinical Impression Pleasant gentleman with supportive daughter and son who will do what he needs.  He is very motivated and talks very fast, needs to slow down.  Should do well  here.  Lucy Chris 05/26/2012,  10:13 AM

## 2012-05-26 NOTE — Progress Notes (Signed)
Occupational Therapy Session Note  Patient Details  Name: Dustin West MRN: [id number] Date of Birth: [birth year]  Today's Date: 05/26/2012 Time: 4132-4401 (1015-1100) Time Calculation (min): 60 min (45 min)  Short Term Goals: Week 1:  OT Short Term Goal 1 (Week 1): Pt. will bathe UB with minimal assist ( ) OT Short Term Goal 2 (Week 1): Pt. will bathe LB with minimal assist OT Short Term Goal 3 (Week 1): Pt.  will go from sit to stand with supervison  Skilled Therapeutic Interventions/Progress Updates:    Session 1: ADL re-training session completed in shower this AM. Session with focus standing balance, ADL performance, functional transfers. Patient did not have any LOB while seated. Patient had one incident of LOB posteriorly when standing. Min Assist required for balance when standing. Patient educated on hemi dressing techniques and shoe buttons to increase functional performance during dressing.    Session 2: Patient participated in NM re-ed session on mat table using powder board and weightbearing techniques.  Pt also participated in therex session to increase R UE strength and endurance. See below for exercises. Patient required min verbal and physical cues to completed exercises.  Therapy Documentation Precautions:  Precautions Precautions: Fall Precaution Comments: left knee buckles Restrictions Weight Bearing Restrictions: No Pain: Pain Assessment Pain Assessment: No/denies pain Exercises:  05/26/12 1015  General Exercises - Upper Extremity  Shoulder Flexion Left;5 reps;Sidelying;AROM (with powder board and tapping for facilitation) Min physical assist  Shoulder Extension Left;5 reps;Sidelying;AROM (with powder board and tapping for faciliation) Min physical assist   General Exercises - Upper Extremity Shoulder Flexion: Strengthening;Right;20 reps;Seated;Bar weights/barbell Bar Weights/Barbell (Shoulder Flexion): 5 lbs Shoulder Extension:  Strengthening;Right;20 reps;Seated;Bar weights/barbell Bar Weights/Barbell (Shoulder Extension): 5 lbs Shoulder Horizontal ABduction: Strengthening;Right;20 reps;Seated;Bar weights/barbell Bar Weights/Barbell (Shoulder Horizontal Abduction): 5 lbs Shoulder Horizontal ADduction: Strengthening;Right;20 reps;Seated;Bar weights/barbell Bar Weights/Barbell (Shoulder Horizontal Adduction): 5 lbs Elbow Flexion: Strengthening;Right;20 reps;Seated;Bar weights/barbell Bar Weights/Barbell (Elbow Flexion): 5 lbs Elbow Extension: Strengthening;Right;20 reps;Seated;Bar weights/barbell Bar Weights/Barbell (Elbow Extension): 5 lbs Other Exercises Other Exercises: A/ROM; left; sidelying; 5 reps; scapular protraction/retraction; with powder board and tapping for faciliation Other Exercises: A/ROM; scapular elevation; 5 reps; tapping for faciliation Other Exercises: A/ROM; scapular retraction; 5 reps; seated; physical cues Other Treatments: Treatments Neuromuscular Facilitation: Left;Upper Extremity;Activity to increase lateral weight shifting;Activity to increase sustained activation;Forced use Weight Bearing Technique Weight Bearing Technique: Yes LUE Weight Bearing Technique: Forearm seated;Extended arm seated Response to Weight Bearing Technique: No complaints. Pt tolerated well. 3x5 for extended arm seated. 5x5 for forearem seated  See FIM for current functional status  Therapy/Group: Individual Therapy  [name], OTR/L 05/26/2012, 11:28 AM

## 2012-05-26 NOTE — Progress Notes (Signed)
Subjective/Complaints: Pleasant alert, good appetite. Denies pain. A 12 point review of systems has been performed and if not noted above is otherwise negative.  Objective: Vital Signs: Blood pressure 115/73, pulse 67, temperature 98.2 F (36.8 C), temperature source Oral, resp. rate 19, height 5\' 6"  (1.676 m), weight 66.089 kg (145 lb 11.2 oz), SpO2 95.00%. No results found.  Basename 05/26/12 0612  WBC 5.0  HGB 14.2  HCT 42.6  PLT 219    Basename 05/26/12 0612  NA 134*  K 3.9  CL 97  GLUCOSE 101*  BUN 21  CREATININE 1.07  CALCIUM 9.6   CBG (last 3)  No results found for this basename: GLUCAP:3 in the last 72 hours  Wt Readings from Last 3 Encounters:  05/23/12 66.089 kg (145 lb 11.2 oz)  05/23/12 70.761 kg (156 lb)    Physical Exam:  Constitutional: She is oriented to person, place, and time. She appears well-developed and well-nourished.  HENT:  Head: Normocephalic and atraumatic.  Right Ear: External ear normal.  Left Ear: External ear normal.  Nose: Nose normal.  Mouth/Throat: Oropharynx is clear and moist.  Eyes: Conjunctivae normal and EOM are normal. Pupils are equal, round, and reactive to light.  Neck: Normal range of motion. Neck supple. No JVD present. No tracheal deviation present. No thyromegaly present.  Cardiovascular: Normal rate, regular rhythm and normal heart sounds. Exam reveals no gallop and no friction rub.  No murmur heard.  Respiratory: Breath sounds normal. She is in respiratory distress. She has no wheezes. She has no rales. She exhibits no tenderness.  GI: Soft. Bowel sounds are normal. She exhibits no distension. There is no tenderness.  Musculoskeletal: Normal range of motion. She exhibits no edema.  Lymphadenopathy:  She has no cervical adenopathy.  Neurological: She is alert and oriented to person, place, and time.  Stuttering Speech--low volume. Follows basic commands without difficulty. Mild left facial weakness. 1/2 sensation left  face, arm, leg. Left deltoid, bicep, tricep--1 to 1+, absent wrist and hand, HF 2+, KE and KF - 3, absent ADF and APF. DTR's 2+ right, trace on left. cognitvely he's intact.  Skin: Skin is warm and dry.  Psychiatric: She has a normal mood and affect. Her behavior is normal. Judgment and thought content normal.    Assessment/Plan: 1. Functional deficits secondary to thrombotic right pontine infarct which require 3+ hours per day of interdisciplinary therapy in a comprehensive inpatient rehab setting. Physiatrist is providing close team supervision and 24 hour management of active medical problems listed below. Physiatrist and rehab team continue to assess barriers to discharge/monitor patient progress toward functional and medical goals. FIM: FIM - Bathing Bathing Steps Patient Completed: Chest;Right Arm;Left Arm;Abdomen;Right upper leg;Left upper leg;Right lower leg (including foot);Left lower leg (including foot) Bathing: 3: Mod-Patient completes 5-7 74f 10 parts or 50-74%  FIM - Upper Body Dressing/Undressing Upper body dressing/undressing steps patient completed: Thread/unthread right sleeve of pullover shirt/dresss;Pull shirt over trunk;Put head through opening of pull over shirt/dress Upper body dressing/undressing: 4: Min-Patient completed 75 plus % of tasks FIM - Lower Body Dressing/Undressing Lower body dressing/undressing steps patient completed: Thread/unthread right underwear leg;Thread/unthread right pants leg;Don/Doff right sock (wore no shoes today) Lower body dressing/undressing: 2: Max-Patient completed 25-49% of tasks  FIM - Toileting Toileting: 0: Activity did not occur  FIM - Diplomatic Services operational officer Devices: Grab bars Toilet Transfers: 4-To toilet/BSC: Min A (steadying Pt. > 75%);4-From toilet/BSC: Min A (steadying Pt. > 75%)  FIM - Bed/Chair Transfer  Bed/Chair Transfer Assistive Devices: Bed rails Bed/Chair Transfer: 5: Supine > Sit: Supervision  (verbal cues/safety issues);3: Bed > Chair or W/C: Mod A (lift or lower assist)  FIM - Locomotion: Wheelchair Locomotion: Wheelchair: 0: Activity did not occur FIM - Locomotion: Ambulation Locomotion: Ambulation Assistive Devices: Chief Operating Officer Ambulation/Gait Assistance: 4: Min assist Locomotion: Ambulation: 1: Travels less than 50 ft with minimal assistance (Pt.>75%)  Comprehension Comprehension Mode: Auditory Comprehension: 5-Understands complex 90% of the time/Cues < 10% of the time  Expression Expression Mode: Verbal Expression: 5-Expresses complex 90% of the time/cues < 10% of the time  Social Interaction Social Interaction: 5-Interacts appropriately 90% of the time - Needs monitoring or encouragement for participation or interaction.  Problem Solving Problem Solving Mode: Not assessed Problem Solving: 3-Solves basic 50 - 74% of the time/requires cueing 25 - 49% of the time  Memory Memory Mode: Not assessed Memory: 5-Recognizes or recalls 90% of the time/requires cueing < 10% of the time  Medical Problem List and Plan:  1. DVT Prophylaxis/Anticoagulation: Pharmaceutical: Lovenox  2. Pain Management: N/A at thsi point 3. Mood: No signs of distress noted. Will monitor for now. LCSW to follow up for formal evaluation.  4. HTN: Recent diagnosis. Monitoring with bid checks.   Renal status nl.  - Education regarding BP control in the context of stroke secondary prevention.  5. Dyslipidemia: continue zocor for management  LOS (Days) 3 A FACE TO FACE EVALUATION WAS PERFORMED  Hallelujah Wysong T 05/26/2012 7:36 AM

## 2012-05-26 NOTE — Progress Notes (Signed)
Patient information reviewed and entered into eRehab system by Tiffinie Caillier, RN, CRRN, PPS Coordinator.  Information including medical coding and functional independence measure will be reviewed and updated through discharge.     Per nursing patient was given "Data Collection Information Summary for Patients in Inpatient Rehabilitation Facilities with attached "Privacy Act Statement-Health Care Records" upon admission.  

## 2012-05-26 NOTE — Evaluation (Signed)
Recreational Therapy Assessment and Plan  Patient Details  Name: NAFTULA DONAHUE MRN: [id number] Date of Birth: 03-Jun-1945 Today's Date: 05/26/2012  Rehab Potential: Good ELOS: 2 weeks   Assessment Clinical Impression: Past Medical History:  Past Medical History   Diagnosis  Date   .  Hypertension      diagnosed 05/14/12   .  Stroke    .  Cancer    .  Hepatitis A     Past Surgical History:  Past Surgical History   Procedure  Date   .  Hernia repair     Assessment & Plan  Clinical Impression:Rudransh JAHMARI ESBENSHADE is an 67 y.o. RH-male with history of HTN, TIA event 05/14/12, who was readmitted to [facility] on 05/20/12 with left sided weakness and numbness. Echo done on prior visit with EF of 60-65% with mild LVH. 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 MRA head and neck without stenosis. He reported cough due to lisinopril and was changed over to cozaar. 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 Patient continues to be impaired in ability to carry out ADLS as well as deficits in balance. Therapy team working on pregait activities and recommended intensive therapies. Patient admitted for CIR today. Patient transferred to CIR on 05/23/2012 .   Pt presents with decreased activity tolerance, decreased functional mobility, decreased balance, left sided weakness, dysarthria limiting pt's independence with leisure/community pursuits.  Leisure History/Participation Premorbid leisure interest/current participation: Ashby Dawes - Vegetable gardening;Community - Shopping mall;Community - Grocery store;Crafts - Woodworking;Crafts - Other (Comment);Games - Cards;Games - Cross-word (carpentry) Other Leisure Interests: Television;Cooking/Baking Leisure Participation Style: Alone;With Family/Friends Awareness of Community Resources: Good-identify 3 post discharge leisure resources Psychosocial / Spiritual Patient agreeable to Pet Therapy: No Does patient have pets?: No Social interaction -  Mood/Behavior: Cooperative Film/video editor for Education?: Yes Patient Agreeable to Outing?: Yes Recreational Therapy Orientation Orientation -Reviewed with patient: Available activity resources Strengths/Weaknesses Patient Strengths/Abilities: Willingness to participate Patient weaknesses: Physical limitations  Plan Rec Therapy Plan Is patient appropriate for Therapeutic Recreation?: Yes Rehab Potential: Good Treatment times per week: Min 1 time per week >20 minutes Estimated Length of Stay: 2 weeks TR Treatment/Interventions: Adaptive equipment instruction;1:1 session;Balance/vestibular training;Community reintegration;Functional mobility training;Patient/family education;Recreation/leisure participation;Therapeutic activities;Therapeutic exercise;UE/LE Coordination activities  Recommendations for other services: None  Discharge Criteria: Patient will be discharged from TR if patient refuses treatment 3 consecutive times without medical reason.  If treatment goals not met, if there is a change in medical status, if patient makes no progress towards goals or if patient is discharged from hospital.  The above assessment, treatment plan, treatment alternatives and goals were discussed and mutually agreed upon: by patient  Sacheen Arrasmith 05/26/2012, 2:52 PM

## 2012-05-26 NOTE — Progress Notes (Signed)
Speech Language Pathology Daily Session Note  Patient Details  Name: Dustin West MRN: [id number] Date of Birth: 09/11/1945  Today's Date: 05/26/2012 Time: 1310-1335 Time Calculation (min): 25 min  Short Term Goals: Week 1: SLP Short Term Goal 1 (Week 1): Patient will perform diaphragmatic breathing exercises with supervision level veral cues SLP Short Term Goal 2 (Week 1): Patient will perform oral motor exercises with supervision level veral cues SLP Short Term Goal 3 (Week 1): Patient will identify 2 compensatory strategies that improve speech intelligibility SLP Short Term Goal 4 (Week 1): Patient will utilize speech intelligibility strategies during structured activities at the sentence-conversational level with min assist verbal/non-verbal cues  Skilled Therapeutic Interventions: Skilled treatment session focused on addressing speech intelligibility goals.  Upon entering the room patient was performing previously given oral motor exercises and with min assist verbal, visual cues returned demonstration of diaphragmatic breathing.  SLP modified environment by eliminating external noises and during a structured description barrier activity patient required mod assist verbal and non-verbal cues to self monitor and correct rate of speech and vocal intensity.     FIM:  Comprehension Comprehension Mode: Auditory Comprehension: 6-Follows complex conversation/direction: With extra time/assistive device Expression Expression Mode: Verbal Expression: 3-Expresses basic 50 - 74% of the time/requires cueing 25 - 50% of the time. Needs to repeat parts of sentences. Social Interaction Social Interaction: 6-Interacts appropriately with others with medication or extra time (anti-anxiety, antidepressant). Problem Solving Problem Solving: 5-Solves basic problems: With no assist Memory Memory: 6-Assistive device: No helper FIM - Eating Eating Activity: 7: Complete independence:no  helper  Pain Pain Assessment Pain Assessment: No/denies pain  Therapy/Group: Individual Therapy  Charlane Ferretti., CCC-SLP [phone]  Dustin West 05/26/2012, 4:45 PM

## 2012-05-27 ENCOUNTER — Inpatient Hospital Stay (HOSPITAL_COMMUNITY): Payer: Medicare Other

## 2012-05-27 ENCOUNTER — Ambulatory Visit (HOSPITAL_COMMUNITY): Payer: Medicare Other | Admitting: Speech Pathology

## 2012-05-27 MED ORDER — PNEUMOCOCCAL VAC POLYVALENT 25 MCG/0.5ML IJ INJ
0.5000 mL | INJECTION | INTRAMUSCULAR | Status: AC
  Filled 2012-05-27: qty 0.5

## 2012-05-27 NOTE — Progress Notes (Signed)
Physical Therapy Note  Patient Details  Name: Dustin West MRN: [id number] Date of Birth: 1945/05/22 Today's Date: 05/27/2012  10:30 - 11:15 45 minutes Individual session Patient denies pain.  Treatment focused on left LE re-education, gait with rolling walker, and wheelchair mobility. Patient performed active to active assistive exercises in sitting for right LE.  Patient ambulated with moderate hand held assist 80 feet. Patient needs facilitation for hip extension on left during stance; has decreased knee control on left during stance; decreased hip and knee flexion and dorsiflexion during swing on left. Patient ambulated with rolling walker using walker splint with min assist for balance and facilitation to correct above gait deviations. Patient practiced mat to wheelchair transfers and was able to perform squat pivot with supervision. Patient propelled wheelchair 200 feet using right extremities with supervision.   [name] 05/27/2012, 11:49 AM

## 2012-05-27 NOTE — Progress Notes (Signed)
Speech Language Pathology Daily Session Note  Patient Details  Name: Dustin West MRN: [id number] Date of Birth: 1946/03/18  Today's Date: 05/27/2012 Time: 1130-1200 Time Calculation (min): 30 min  Short Term Goals: Week 1: SLP Short Term Goal 1 (Week 1): Patient will perform diaphragmatic breathing exercises with supervision level veral cues SLP Short Term Goal 1 - Progress (Week 1): Progressing toward goal SLP Short Term Goal 2 (Week 1): Patient will perform oral motor exercises with supervision level veral cues SLP Short Term Goal 2 - Progress (Week 1): Partly met SLP Short Term Goal 3 (Week 1): Patient will identify 2 compensatory strategies that improve speech intelligibility SLP Short Term Goal 3 - Progress (Week 1): Progressing toward goal SLP Short Term Goal 4 (Week 1): Patient will utilize speech intelligibility strategies during structured activities at the sentence-conversational level with min assist verbal/non-verbal cues SLP Short Term Goal 4 - Progress (Week 1): Progressing toward goal  Skilled Therapeutic Interventions: Session focused on addressing speech intelligibility.  Pt completing oral-motor exercises independently outside of treatment session.  During structured conversation task, with goal of task identified, pt required overall moderate verbal cues to monitor rate/intensity.  Able to carry-out  instructions immediately after cueing, then within 30 sec -  one minute, rate increased and articulatory accuracy deteriorated, with pt showing no recognition of speech breakdown.       FIM:  Comprehension Comprehension Mode: Auditory Comprehension: 6-Follows complex conversation/direction: With extra time/assistive device Expression Expression Mode: Verbal Expression: 3-Expresses basic 50 - 74% of the time/requires cueing 25 - 50% of the time. Needs to repeat parts of sentences. Social Interaction Social Interaction: 6-Interacts appropriately with others with  medication or extra time (anti-anxiety, antidepressant). Problem Solving Problem Solving: 5-Solves basic problems: With no assist Memory Memory: 6-Assistive device: No helper FIM - Eating Eating Activity: 0: Activity did not occur  Pain Pain Assessment Pain Assessment: No/denies pain  Therapy/Group: Individual Therapy  Blenda Mounts Laurice 05/27/2012, 12:13 PM

## 2012-05-27 NOTE — Progress Notes (Signed)
Occupational Therapy Session Note  Patient Details  Name: Dustin West MRN: [id number] Date of Birth: 09/20/1945  Today's Date: 05/27/2012 Time: 0272-5366  Time Calculation (min): 60 min   Short Term Goals: Week 1:  OT Short Term Goal 1 (Week 1): Pt. will bathe UB with minimal assist ( ) OT Short Term Goal 2 (Week 1): Pt. will bathe LB with minimal assist OT Short Term Goal 3 (Week 1): Pt.  will go from sit to stand with supervison  Skilled Therapeutic Interventions/Progress Updates:   ADL re-training session completed in shower this AM. Session with focus on standing balance, ADL performance, functional transfers. Patient did not have any LOB while seated. Min Assist required for balance when standing. Patient making great progress towards goals. Patient shaved at sink with set-up. Min Assist required for donning left shoe. Patient given long handled shoe horn to use while in rehab to increase functional performance. vc's for using show buttons. Will try elastic shoelaces at tomorrow's session.     Therapy Documentation Precautions:  Precautions Precautions: Fall Precaution Comments: left knee buckles Restrictions Weight Bearing Restrictions: No Pain: Pain Assessment Pain Assessment: No/denies pain   See FIM for current functional status  Therapy/Group: Individual Therapy  [name], OTR/L 05/27/2012, 9:20 AM

## 2012-05-27 NOTE — Progress Notes (Signed)
Physical Therapy Note  Patient Details  Name: GWYNN CROSSLEY MRN: [id number] Date of Birth: 04-Jul-1945 Today's Date: 05/27/2012  1:30 - 2:15 45 minutes (patient missed 15 minutes reporting fatigued and needed to return to room) Individual session Patient denies pain.  Treatment focused on neuromuscular re-education of left LE, gait and facilitation of stance on left LE. Patient propelled wheelchair 60 feet with right extremities. Patient reported that right LE was getting tired. Patient exercised on kinetron working on left LE facilitation for hip and knee extension in sitting and standing. Patient sit to stand on kinetron working on equal weight bearing on LE's. Patient ambulated with rolling walker and walker splint 80 feet x 2 with min assist to facilitate hip and knee control on left. Patient required cueing to step through with right LE. Patient performed step up exercises onto 4 inch step 15 reps x 2 sets to facilitate stance on left. Patient performed step up with left LE to increase hip and knee flexion on left during swing. Patient transferred wheelchair to bed with supervision and sit to supine with supervision using bed rails.    [name] 05/27/2012, 2:21 PM

## 2012-05-27 NOTE — Progress Notes (Signed)
Subjective/Complaints: Pleasant alert, good appetite. Frustrated that left arm isn't getting stronger A 12 point review of systems has been performed and if not noted above is otherwise negative.  Objective: Vital Signs: Blood pressure 134/74, pulse 70, temperature 98.1 F (36.7 C), temperature source Oral, resp. rate 18, height 5\' 6"  (1.676 m), weight 66.089 kg (145 lb 11.2 oz), SpO2 96.00%. No results found.  Basename 05/26/12 0612  WBC 5.0  HGB 14.2  HCT 42.6  PLT 219    Basename 05/26/12 0612  NA 134*  K 3.9  CL 97  GLUCOSE 101*  BUN 21  CREATININE 1.07  CALCIUM 9.6   CBG (last 3)  No results found for this basename: GLUCAP:3 in the last 72 hours  Wt Readings from Last 3 Encounters:  05/23/12 66.089 kg (145 lb 11.2 oz)  05/23/12 70.761 kg (156 lb)    Physical Exam:  Constitutional: She is oriented to person, place, and time. She appears well-developed and well-nourished.  HENT:  Head: Normocephalic and atraumatic.  Right Ear: External ear normal.  Left Ear: External ear normal.  Nose: Nose normal.  Mouth/Throat: Oropharynx is clear and moist.  Eyes: Conjunctivae normal and EOM are normal. Pupils are equal, round, and reactive to light.  Neck: Normal range of motion. Neck supple. No JVD present. No tracheal deviation present. No thyromegaly present.  Cardiovascular: Normal rate, regular rhythm and normal heart sounds. Exam reveals no gallop and no friction rub.  No murmur heard.  Respiratory: Breath sounds normal. She is in respiratory distress. She has no wheezes. She has no rales. She exhibits no tenderness.  GI: Soft. Bowel sounds are normal. She exhibits no distension. There is no tenderness.  Musculoskeletal: Normal range of motion. She exhibits no edema.  Lymphadenopathy:  She has no cervical adenopathy.  Neurological: She is alert and oriented to person, place, and time.  Low volume, somewhat dysarthric speech. Reasonable insight and awaeness.   Mild left  facial weakness. 1/2 sensation left face, arm, leg. Left deltoid, bicep, tricep--1 to 1+, absent wrist and hand, HF 2+, KE and KF - 3, absent ADF and APF. DTR's 2+ right, trace on left. cognitvely he's intact.  Skin: Skin is warm and dry.  Psychiatric: She has a normal mood and affect. Her behavior is normal. Judgment and thought content normal.    Assessment/Plan: 1. Functional deficits secondary to thrombotic right pontine infarct which require 3+ hours per day of interdisciplinary therapy in a comprehensive inpatient rehab setting. Physiatrist is providing close team supervision and 24 hour management of active medical problems listed below. Physiatrist and rehab team continue to assess barriers to discharge/monitor patient progress toward functional and medical goals. FIM: FIM - Bathing Bathing Steps Patient Completed: Chest;Right Arm;Left Arm;Abdomen;Front perineal area;Buttocks;Right upper leg;Left upper leg;Right lower leg (including foot);Left lower leg (including foot) Bathing: 4: Steadying assist  FIM - Upper Body Dressing/Undressing Upper body dressing/undressing steps patient completed: Thread/unthread right sleeve of pullover shirt/dresss;Thread/unthread left sleeve of pullover shirt/dress;Put head through opening of pull over shirt/dress;Pull shirt over trunk Upper body dressing/undressing: 5: Set-up assist to: Obtain clothing/put away FIM - Lower Body Dressing/Undressing Lower body dressing/undressing steps patient completed: Thread/unthread right underwear leg;Thread/unthread left underwear leg;Pull underwear up/down;Thread/unthread right pants leg;Thread/unthread left pants leg;Pull pants up/down;Don/Doff right sock;Don/Doff left sock;Don/Doff right shoe;Don/Doff left shoe;Fasten/unfasten right shoe;Fasten/unfasten left shoe Lower body dressing/undressing: 4: Min-Patient completed 75 plus % of tasks  FIM - Toileting Toileting steps completed by patient: Adjust clothing prior to  toileting;Performs perineal hygiene;Adjust  clothing after toileting Toileting: 0: Activity did not occur  FIM - Diplomatic Services operational officer Devices: Grab bars Toilet Transfers: 0-Activity did not occur  FIM - Banker Devices: Bed rails;Arm rests Bed/Chair Transfer: 4: Chair or W/C > Bed: Min A (steadying Pt. > 75%);4: Bed > Chair or W/C: Min A (steadying Pt. > 75%)  FIM - Locomotion: Wheelchair Locomotion: Wheelchair: 4: Travels 150 ft or more: maneuvers on rugs and over door sillls with minimal assistance (Pt.>75%) FIM - Locomotion: Ambulation Locomotion: Ambulation Assistive Devices: Other (comment) (HHA) Ambulation/Gait Assistance: 3: Mod assist Locomotion: Ambulation: 1: Travels less than 50 ft with moderate assistance (Pt: 50 - 74%)  Comprehension Comprehension Mode: Auditory Comprehension: 6-Follows complex conversation/direction: With extra time/assistive device  Expression Expression Mode: Verbal Expression: 3-Expresses basic 50 - 74% of the time/requires cueing 25 - 50% of the time. Needs to repeat parts of sentences.  Social Interaction Social Interaction: 6-Interacts appropriately with others with medication or extra time (anti-anxiety, antidepressant).  Problem Solving Problem Solving Mode: Not assessed Problem Solving: 5-Solves basic problems: With no assist  Memory Memory Mode: Not assessed Memory: 6-Assistive device: No helper  Medical Problem List and Plan:  1. DVT Prophylaxis/Anticoagulation: Pharmaceutical: Lovenox  2. Pain Management: N/A at thsi point 3. Mood: No signs of distress noted. Will monitor for now. LCSW to follow up for formal evaluation.  4. HTN: Recent diagnosis. Monitoring with bid checks.   Renal status nl.  - Education regarding BP control in the context of stroke secondary prevention.  5. Dyslipidemia: continue zocor for management  LOS (Days) 4 A FACE TO FACE EVALUATION WAS  PERFORMED  Lissie Hinesley T 05/27/2012 7:33 AM

## 2012-05-28 ENCOUNTER — Inpatient Hospital Stay (HOSPITAL_COMMUNITY): Payer: Medicare Other

## 2012-05-28 ENCOUNTER — Inpatient Hospital Stay (HOSPITAL_COMMUNITY): Payer: Medicare Other | Admitting: Physical Therapy

## 2012-05-28 DIAGNOSIS — I6330 Cerebral infarction due to thrombosis of unspecified cerebral artery: Secondary | ICD-10-CM

## 2012-05-28 NOTE — Progress Notes (Signed)
Occupational Therapy Session Note  Patient Details  Name: Dustin West MRN: [id number] Date of Birth: July 22, 1945  Today's Date: 05/28/2012 Time: 1000-1045 Time Calculation (min): 45 min  Short Term Goals: Week 1:  OT Short Term Goal 1 (Week 1): Pt. will bathe UB with minimal assist ( ) OT Short Term Goal 2 (Week 1): Pt. will bathe LB with minimal assist OT Short Term Goal 3 (Week 1): Pt.  will go from sit to stand with supervison  Skilled Therapeutic Interventions/Progress Updates:   Pt sitting in wheelchair upon arrival. Began with pt completing sh shrugs x 10 and PROM to LUE with pt sitting: sh flexion, sh ABD, elbow flexion/extension, sh circles x 15, 2 sets. No pain reported during stretching. Wt bearing activity @ EOB with pt leaning side to side, focusing on placing wt on L elbow/forearm, then L hand x 20, 2 sets.  Rehearsed safe transfers from wc <> tub bench x 2 with pt requiring min A for safety. Pt states he would like to get back to bathing in his tub/shower which is upstairs at his home. Pt required verbal cues to standing all the way from tub bench to align himself better before sitting in wheelchair.   Therapy Documentation Precautions:  Precautions Precautions: Fall Precaution Comments: left knee buckles Restrictions Weight Bearing Restrictions: No     Pain: Pain Assessment Pain Assessment: No/denies pain ADL: ADL Grooming: Minimal assistance Upper Body Bathing: Moderate assistance Where Assessed-Upper Body Bathing: Sitting at sink;Wheelchair Lower Body Bathing: Moderate assistance Where Assessed-Lower Body Bathing: Sitting at sink;Wheelchair Upper Body Dressing: Moderate assistance Where Assessed-Upper Body Dressing: Wheelchair;Sitting at sink Lower Body Dressing: Moderate assistance Where Assessed-Lower Body Dressing: Wheelchair;Sitting at sink Toilet Transfer: Moderate assistance Exercises:      See FIM for current functional status  Therapy/Group:  Individual Therapy  Dustin West 05/28/2012, 10:49 AM

## 2012-05-28 NOTE — Progress Notes (Signed)
Physical Therapy Session Note  Patient Details  Name: Dustin West MRN: [id number] Date of Birth: 07/04/1945  Today's Date: 05/28/2012 Time: 2130-8657 Time Calculation (min): 41 min  Short Term Goals: Week 1:  PT Short Term Goal 1 (Week 1): Patient will be able to perform bed mobility with S/Mod-I PT Short Term Goal 2 (Week 1): Patient will be able to perform transfers with S/Mod-I PT Short Term Goal 3 (Week 1): Patient will ambulate using LRAD x 150' in controlled environmnet with S/Mod-I PT Short Term Goal 4 (Week 1): Patient will ascend/descend 4 steps with hand rail with Mod-A   Skilled Therapeutic Interventions/Progress Updates:   Pt wanting to work on walking.  Gait training x 70' with RW on unit, needing facilitation of L knee to prevent hyperextension in stance phase.  Dynamic standing activities including:  Standing on LLE while kicking a ball with the RLE, and RLE on foam balance beam while reaching and then pitching horseshoes.  Pt needing mod- max@ for balance to perform activities due to losing balance posteriorly.  5 steps with rail with min-mod@, pt with decreased hip flexion on the L to clear steps when ascending.  Difficulty with knee control on L when descending steps.  Transfer back to bed with min@, sit to supine with supervision.  Therapy Documentation Precautions:  Precautions Precautions: Fall Precaution Comments: left knee buckles Restrictions Weight Bearing Restrictions: No Pain:  No pain See FIM for current functional status  Therapy/Group: Individual Therapy  Dustin West 05/28/2012, 2:58 PM

## 2012-05-28 NOTE — Progress Notes (Signed)
Physical Therapy Session Note  Patient Details  Name: Dustin West MRN: [id number] Date of Birth: 14-May-1946  Today's Date: 05/28/2012 Time: 0802-0845 Time Calculation (min): 43 min  Short Term Goals: Week 1:  PT Short Term Goal 1 (Week 1): Patient will be able to perform bed mobility with S/Mod-I PT Short Term Goal 2 (Week 1): Patient will be able to perform transfers with S/Mod-I PT Short Term Goal 3 (Week 1): Patient will ambulate using LRAD x 150' in controlled environmnet with S/Mod-I PT Short Term Goal 4 (Week 1): Patient will ascend/descend 4 steps with hand rail with Mod-A   Skilled Therapeutic Interventions/Progress Updates:   Bed mobility in flat bed without rail with supervision, VCs for normal movement patterns to coming to sitting to L.  Dynamic sitting and standing balance while donning pants, shirt and shoes, with supervision.  Bed> w/c transfer to R with min assist, VCs for including LLE.  W/c mobility using RUE and RLE in hemi method x 160' with supervision, without cues.  Self ROM with demo and VCs in sitting bil hamstrings and heel cords using bench and strap.  neuromuscular re-education via forced use, manual cues, VCs for : -dynamic standing activity reaching out of BOS in all directions with RUE, mod assist to squat and pivot when reaching across midline -gait x 80' with RW , min assist, focusing on L stance stabilty, increasing R step length, forward gaze and upright posture      Therapy Documentation Precautions:  Precautions Precautions: Fall Precaution Comments: left knee buckles Restrictions Weight Bearing Restrictions: No General:   Vital Signs:   Pain: Pain Assessment Pain Assessment: No/denies pain Mobility:   Locomotion : Ambulation Ambulation/Gait Assistance: 4: Min assist  Trunk/Postural Assessment :    Balance:   Exercises:   Other Treatments:    See FIM for current functional status  Therapy/Group: Individual  Therapy  Elward Nocera 05/28/2012, 9:56 AM

## 2012-05-28 NOTE — Progress Notes (Signed)
Occupational Therapy Session Note  Patient Details  Name: Dustin West MRN: [id number] Date of Birth: 27-Jun-1945  Today's Date: 05/28/2012 Time: 4098-1191  Time Calculation (min): 60 min   Short Term Goals: Week 1:  OT Short Term Goal 1 (Week 1): Pt. will bathe UB with minimal assist ( ) OT Short Term Goal 2 (Week 1): Pt. will bathe LB with minimal assist OT Short Term Goal 3 (Week 1): Pt.  will go from sit to stand with supervison  Skilled Therapeutic Interventions/Progress Updates:   ADL re-training session completed in shower this AM. Session with focus on standing balance, ADL performance, functional transfers. Patient ambulated from w/c to toilet then to shower with rolling walker and Min Assist. Patient required vc's when walking to slow down and advance left foot completely before continuing forward with walker. Min Assist required for balance during all standing activities. Patient still required Min assist for donning left shoe with long handled shoe horn. Pt still making good progress towards goals.     Therapy Documentation Precautions:  Precautions Precautions: Fall Precaution Comments: left knee buckles Restrictions Weight Bearing Restrictions: No Pain: Pain Assessment Pain Assessment: No/denies pain   See FIM for current functional status  Therapy/Group: Individual Therapy  [name], OTR/L 05/28/2012, 11:15 AM

## 2012-05-28 NOTE — Progress Notes (Signed)
Recreational Therapy Session Note  Patient Details  Name: Dustin West MRN: [id number] Date of Birth: March 10, 1946 Today's Date: 05/28/2012 Time: 1400-1430 Pain: no c/o Skilled Therapeutic Interventions/Progress Updates: Pt stood to kick a ball with RLE with focus on L knee control and WBing through LLE with Min assist.  Pt stood reaching outside BOS during horseshoe activity while RLE was up on a foam balance beam with mod-max assist for balance.  Pt with posterior LOB Therapy/Group: Co-Treatment  Melana Hingle 05/28/2012, 4:44 PM

## 2012-05-28 NOTE — Progress Notes (Signed)
Subjective/Complaints: No new problems. Some fatigue at times. A 12 point review of systems has been performed and if not noted above is otherwise negative.  Objective: Vital Signs: Blood pressure 106/69, pulse 71, temperature 98.3 F (36.8 C), temperature source Oral, resp. rate 19, height 5\' 6"  (1.676 m), weight 66.089 kg (145 lb 11.2 oz), SpO2 95.00%. No results found.  Basename 05/26/12 0612  WBC 5.0  HGB 14.2  HCT 42.6  PLT 219    Basename 05/26/12 0612  NA 134*  K 3.9  CL 97  GLUCOSE 101*  BUN 21  CREATININE 1.07  CALCIUM 9.6   CBG (last 3)  No results found for this basename: GLUCAP:3 in the last 72 hours  Wt Readings from Last 3 Encounters:  05/23/12 66.089 kg (145 lb 11.2 oz)  05/23/12 70.761 kg (156 lb)    Physical Exam:  Constitutional: She is oriented to person, place, and time. She appears well-developed and well-nourished.  HENT:  Head: Normocephalic and atraumatic.  Right Ear: External ear normal.  Left Ear: External ear normal.  Nose: Nose normal.  Mouth/Throat: Oropharynx is clear and moist.  Eyes: Conjunctivae normal and EOM are normal. Pupils are equal, round, and reactive to light.  Neck: Normal range of motion. Neck supple. No JVD present. No tracheal deviation present. No thyromegaly present.  Cardiovascular: Normal rate, regular rhythm and normal heart sounds. Exam reveals no gallop and no friction rub.  No murmur heard.  Respiratory: Breath sounds normal. She is in respiratory distress. She has no wheezes. She has no rales. She exhibits no tenderness.  GI: Soft. Bowel sounds are normal. She exhibits no distension. There is no tenderness.  Musculoskeletal: Normal range of motion. She exhibits no edema.  Lymphadenopathy:  She has no cervical adenopathy.  Neurological: She is alert and oriented to person, place, and time.  Low volume, somewhat dysarthric speech. Reasonable insight and awaeness.   Mild left facial weakness. 1/2 sensation left  face, arm, leg. Left deltoid, bicep, tricep--1 to 1+, absent wrist and hand, HF 2+, KE and KF - 3, absent ADF and APF. DTR's 2+ right, trace on left. cognitvely he's intact.  Skin: Skin is warm and dry.  Psychiatric: She has a normal mood and affect. Her behavior is normal. Judgment and thought content normal.    Assessment/Plan: 1. Functional deficits secondary to thrombotic right pontine infarct which require 3+ hours per day of interdisciplinary therapy in a comprehensive inpatient rehab setting. Physiatrist is providing close team supervision and 24 hour management of active medical problems listed below. Physiatrist and rehab team continue to assess barriers to discharge/monitor patient progress toward functional and medical goals. FIM: FIM - Bathing Bathing Steps Patient Completed: Chest;Right Arm;Left Arm;Abdomen;Front perineal area;Buttocks;Right upper leg;Left upper leg;Right lower leg (including foot);Left lower leg (including foot) Bathing: 4: Steadying assist  FIM - Upper Body Dressing/Undressing Upper body dressing/undressing steps patient completed: Thread/unthread right sleeve of pullover shirt/dresss;Thread/unthread left sleeve of pullover shirt/dress;Put head through opening of pull over shirt/dress;Pull shirt over trunk Upper body dressing/undressing: 5: Set-up assist to: Obtain clothing/put away FIM - Lower Body Dressing/Undressing Lower body dressing/undressing steps patient completed: Thread/unthread right underwear leg;Thread/unthread left underwear leg;Pull underwear up/down;Thread/unthread right pants leg;Thread/unthread left pants leg;Pull pants up/down;Don/Doff right sock;Don/Doff left sock;Don/Doff right shoe;Don/Doff left shoe;Fasten/unfasten right shoe;Fasten/unfasten left shoe Lower body dressing/undressing: 4: Min-Patient completed 75 plus % of tasks  FIM - Toileting Toileting steps completed by patient: Adjust clothing prior to toileting;Performs perineal  hygiene;Adjust clothing after toileting Toileting:  0: Activity did not occur  FIM - Diplomatic Services operational officer Devices: Grab bars Toilet Transfers: 0-Activity did not occur  FIM - Banker Devices: Bed rails;Arm rests;HOB elevated Bed/Chair Transfer: 5: Chair or W/C > Bed: Supervision (verbal cues/safety issues);5: Sit > Supine: Supervision (verbal cues/safety issues)  FIM - Locomotion: Wheelchair Locomotion: Wheelchair: 2: Travels 50 - 149 ft with supervision, cueing or coaxing FIM - Locomotion: Ambulation Locomotion: Ambulation Assistive Devices: Walker - Rolling (with walker splint on left) Ambulation/Gait Assistance: 4: Min assist Locomotion: Ambulation: 2: Travels 50 - 149 ft with minimal assistance (Pt.>75%)  Comprehension Comprehension Mode: Auditory Comprehension: 5-Follows basic conversation/direction: With no assist  Expression Expression Mode: Verbal Expression: 5-Set-up assist with assistive device  Social Interaction Social Interaction Mode: Asleep Social Interaction: 6-Interacts appropriately with others with medication or extra time (anti-anxiety, antidepressant).  Problem Solving Problem Solving Mode: Not assessed Problem Solving: 5-Solves basic 90% of the time/requires cueing < 10% of the time  Memory Memory Mode: Not assessed Memory: 5-Recognizes or recalls 90% of the time/requires cueing < 10% of the time  Medical Problem List and Plan:  1. DVT Prophylaxis/Anticoagulation: Pharmaceutical: Lovenox  2. Pain Management: N/A at thsi point 3. Mood: team providing positive reinforcement and encouragement 4. HTN: Recent diagnosis. Monitoring with bid checks.   Renal status nl.  - Education regarding BP control in the context of stroke secondary prevention.  5. Dyslipidemia: continue zocor for management  LOS (Days) 5 A FACE TO FACE EVALUATION WAS PERFORMED  SWARTZ,ZACHARY T 05/28/2012 8:14 AM

## 2012-05-28 NOTE — Patient Care Conference (Signed)
Inpatient RehabilitationTeam Conference and Plan of Care Update Date: 05/28/2012   Time: 10:25 AM    Patient Name: Dustin West      Medical Record Number: [id number]  Date of Birth: [birth year] Sex: Male         Room/Bed: 4037/4037-01 Payor Info: Payor: MEDICARE  Plan: MEDICARE PART A AND B  Product Type: *No Product type*     Admitting Diagnosis: R CVA  Admit Date/Time:  05/23/2012  1:12 PM Admission Comments: No comment available   Primary Diagnosis:  CVA (cerebral infarction) Principal Problem: CVA (cerebral infarction)  Patient Active Problem List   Diagnosis Date Noted  . CVA (cerebral infarction) 05/26/2012  . HTN (hypertension) 05/26/2012    Expected Discharge Date: Expected Discharge Date: 06/06/12  Team Members Present: Physician leading conference: Dr. 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 Functional transfer: Min Assist. Patient has 2-/5 Left shoulder flexion/extension. 0/5 in all other ranges.   Min Assist - Supervision  A/ROM L UE, family education, standing balance, functional transfers, ADL performance   Mobility   close supervision to occasional min assist for transfers, min assist with ambulation   Mod I wheelchair,  supervision gait; min assist stairs  NMR left LE, balance, gait, stairs   Communication             Safety/Cognition/ Behavioral Observations            Pain   no c/o pain  no c/o pain  monitor   Skin   C.D.I., no skin issues  no new breakdown  monitor, skin breakdown prevention interventions      *See Interdisciplinary Assessment and Plan and progress notes for long and short-term goals  Barriers to Discharge: left hemiparesis, safety will require supervision    Possible Resolutions to Barriers:  need to establish supervision at home    Discharge Planning/Teaching Needs:  Son to move in with to assist.  Daughter around the corner pt can go there also.      Team Discussion:  Making good progress, hard to understand needs to slow down in speech.  Balance issues working on-will need 24hr supervision at d/c  Revisions to Treatment Plan:  None   Continued Need for Acute Rehabilitation Level of Care: The patient requires daily medical management by a physician with specialized training in physical medicine and rehabilitation for the following conditions: Daily direction of a multidisciplinary physical rehabilitation program to ensure safe treatment while eliciting the highest outcome that is of practical value to the patient.: Yes Daily medical management of patient stability for increased activity during participation in an intensive rehabilitation regime.: Yes Daily analysis of laboratory values and/or radiology reports with any subsequent need for medication adjustment of medical intervention for : Cardiac problems;Neurological problems;Other  Negar Sieler, Lemar Livings 05/29/2012, 2:25 PM

## 2012-05-29 ENCOUNTER — Inpatient Hospital Stay (HOSPITAL_COMMUNITY): Payer: Medicare Other | Admitting: Occupational Therapy

## 2012-05-29 ENCOUNTER — Inpatient Hospital Stay (HOSPITAL_COMMUNITY): Payer: Medicare Other | Admitting: *Deleted

## 2012-05-29 ENCOUNTER — Inpatient Hospital Stay (HOSPITAL_COMMUNITY): Payer: Medicare Other

## 2012-05-29 ENCOUNTER — Inpatient Hospital Stay (HOSPITAL_COMMUNITY): Payer: Medicare Other | Admitting: Physical Therapy

## 2012-05-29 DIAGNOSIS — G8110 Spastic hemiplegia affecting unspecified side: Secondary | ICD-10-CM

## 2012-05-29 DIAGNOSIS — I6330 Cerebral infarction due to thrombosis of unspecified cerebral artery: Secondary | ICD-10-CM

## 2012-05-29 NOTE — Progress Notes (Signed)
Social Work Patient ID: Dustin West, male   DOB: November 25, 1945, 67 y.o.   MRN: [id number] Met with pt and spoke with daughter via telephone to inform of team conference goals-supervision level and discharge 1/17. Pt is pleased with his progress and daughter happy he is walking.  Encouraged her to come in and attend therapies with pt, so she can See his progress.  She will try to come in next week.  Her brother plans to be there with him at home at discharge.  Discussed follow up needs and daughter has already gotten a PCP for pt. Continue to work on discharge needs.

## 2012-05-29 NOTE — Progress Notes (Signed)
Recreational Therapy Session Note  Patient Details  Name: Dustin West MRN: [id number] Date of Birth: 11-Sep-1945 Today's Date: 05/29/2012 Time: 805-845 Pain: c/o pain, L thumb, RN/MD aware & addressing Skilled Therapeutic Interventions/Progress Updates: Pt ambulated ~100' using RW with min assist, LOB when turning left.  Pt stood for dynamic standing activities including kicking a ball with RLE, horseshoe reaching activity reaching low and to the left and then up and to the right.    Therapy/Group: Co-Treatment   Pastor Sgro 05/29/2012, 10:14 AM

## 2012-05-29 NOTE — Progress Notes (Signed)
Physical Therapy Session Note  Patient Details  Name: Dustin West MRN: [id number] Date of Birth: 08/15/1945  Today's Date: 05/29/2012 Time: 7829-5621 Time Calculation (min): 40 min  Short Term Goals: Week 1:  PT Short Term Goal 1 (Week 1): Patient will be able to perform bed mobility with S/Mod-I PT Short Term Goal 2 (Week 1): Patient will be able to perform transfers with S/Mod-I PT Short Term Goal 3 (Week 1): Patient will ambulate using LRAD x 150' in controlled environmnet with S/Mod-I PT Short Term Goal 4 (Week 1): Patient will ascend/descend 4 steps with hand rail with Mod-A   Skilled Therapeutic Interventions/Progress Updates:   Co-tx with therapeutic Rec for +2 skilled for dynamic functional standing activites  Dynamic sitting and standing balance during donning clothes, with supervision except for 1 LOB backwards when turning L toward RW.  Gait x 100' x 2 with RW with supervision> min assist for 1 LOB when turning L.  neuromuscular re-education via forced use, tactile and VCs for: -L stance stability and wt shiftning during dynamic standing balance while kicking a ball with  R foot; reaching with R hand forward and toward floor then placing up and to R  -L swing phase components when kicking a ball with L foot    Therapy Documentation Precautions:  Precautions Precautions: Fall Precaution Comments: left knee buckles Required Braces or Orthoses: Other Brace/Splint Other Brace/Splint: R hand thumb immobilizer started today for R thumb DIP pain Restrictions Weight Bearing Restrictions: No   Pain: Pain Assessment Pain Assessment: No/denies pain      See FIM for current functional status  Therapy/Group: Individual Therapy  Mao Lockner 05/29/2012, 12:18 PM

## 2012-05-29 NOTE — Progress Notes (Signed)
Physical Therapy Note  Patient Details  Name: Dustin West MRN: [id number] Date of Birth: 08/29/1945 Today's Date: 05/29/2012  Patient missed 30 minutes skilled physical therapy this AM secondary to PT's prior session running late. Will follow up as able.  Zella Richer Batool Majid S. Ersel Wadleigh, PT, DPT  05/29/2012, 12:11 PM

## 2012-05-29 NOTE — Progress Notes (Signed)
Orthopedic Tech Progress Note Patient Details:  Dustin West Dec 02, 1945 [id number] Thumb spica applied to Right wrist. Tolerated well.  Ortho Devices Type of Ortho Device: Thumb velcro splint Ortho Device/Splint Location: Right Wrist Ortho Device/Splint Interventions: Application   [name] 05/29/2012, 8:45 AM

## 2012-05-29 NOTE — Progress Notes (Signed)
Occupational Therapy Session Note  Patient Details  Name: Dustin West MRN: [id number] Date of Birth: May 31, 1945  Today's Date: 05/29/2012 Time: 1335-1400 Time Calculation (min): 25 min  Short Term Goals: Week 1:  OT Short Term Goal 1 (Week 1): Pt. will bathe UB with minimal assist ( ) OT Short Term Goal 2 (Week 1): Pt. will bathe LB with minimal assist OT Short Term Goal 3 (Week 1): Pt.  will go from sit to stand with supervison  Skilled Therapeutic Interventions/Progress Updates:    Pt seen for 1:1 OT with focus on dynamic standing balance and functional reaching with RUE to challenge balance reactions in standing.  Engaged in weight bearing through LUE during reaching task to increase motor control and normalize tone.  Pt with 1 LOB backwards during reaching outside BOS, requiring max assist to maintain and regain balance.  Towel glides on table to increase shoulder flexion/extension and horizontal abduction/adduction with gravity reduced, however continued to require support at elbow.    Therapy Documentation Precautions:  Precautions Precautions: Fall Precaution Comments: left knee buckles Required Braces or Orthoses: Other Brace/Splint Other Brace/Splint: R hand thumb immobilizer started today for R thumb DIP pain Restrictions Weight Bearing Restrictions: No General:   Vital Signs: Therapy Vitals Temp: 98 F (36.7 C) Temp src: Oral Pulse Rate: 74  Resp: 18  BP: 113/72 mmHg Patient Position, if appropriate: Sitting Oxygen Therapy SpO2: 96 % O2 Device: None (Room air) Pain:  Pt with no c/o pain this session.  See FIM for current functional status  Therapy/Group: Individual Therapy  Leonette Monarch 05/29/2012, 4:15 PM

## 2012-05-29 NOTE — Progress Notes (Signed)
Occupational Therapy Session Note  Patient Details  Name: Dustin West MRN: [id number] Date of Birth: 1946/04/28  Today's Date: 05/29/2012 Time: 9147-8295  Time Calculation (min): 60 min   Short Term Goals: Week 1:  OT Short Term Goal 1 (Week 1): Pt. will bathe UB with minimal assist ( ) OT Short Term Goal 2 (Week 1): Pt. will bathe LB with minimal assist OT Short Term Goal 3 (Week 1): Pt.  will go from sit to stand with supervison  Skilled Therapeutic Interventions/Progress Updates:   ADL re-training session completed in tub/shower combo this AM. Session with focus on standing balance, ADL performance, functional transfers, and safety awareness. Patient used walker to transfer to tub bench. vc's for technique and safety.  When transferring from tub bench to w/c with walker, patient moved too quickly and began to sit too far away from w/c. Patient also did not remember to remove left hand from walker splint. Patient Min Assist required for balance during all standing activities. Patient seems to feel over confident when performing tasks and does not take into account his deficits. Patient required max vc's to donn left shoe without use of shoe horn this date. Patient educated and folded laundry minus socks with right arm. Min-mod difficulty. >avg time.   Therapy Documentation Precautions:  Precautions Precautions: Fall Precaution Comments: left knee buckles Restrictions Weight Bearing Restrictions: No Pain: Pain Assessment Pain Assessment: No/denies pain   See FIM for current functional status  Therapy/Group: Individual Therapy  [name], OTR/L 05/29/2012, 10:50 AM

## 2012-05-29 NOTE — Progress Notes (Signed)
Patient ID: Dustin West, male   DOB: 12/26/1945, 67 y.o.   MRN: [id number] Subjective/Complaints: Amb with therapy R thumb pain, fell when he had CVA A 12 point review of systems has been performed and if not noted above is otherwise negative.  Objective: Vital Signs: Blood pressure 120/74, pulse 67, temperature 98.1 F (36.7 C), temperature source Oral, resp. rate 18, height 5\' 6"  (1.676 m), weight 59.575 kg (131 lb 5.4 oz), SpO2 97.00%. No results found. No results found for this basename: WBC:2,HGB:2,HCT:2,PLT:2 in the last 72 hours No results found for this basename: NA:2,K:2,CL:2,CO:2,GLUCOSE:2,BUN:2,CREATININE:2,CALCIUM:2 in the last 72 hours CBG (last 3)  No results found for this basename: GLUCAP:3 in the last 72 hours  Wt Readings from Last 3 Encounters:  05/28/12 59.575 kg (131 lb 5.4 oz)  05/23/12 70.761 kg (156 lb)    Physical Exam:  Constitutional: She is oriented to person, place, and time. She appears well-developed and well-nourished.  HENT:  Head: Normocephalic and atraumatic.  Right Ear: External ear normal.  Left Ear: External ear normal.  Nose: Nose normal.  Mouth/Throat: Oropharynx is clear and moist.  Eyes: Conjunctivae normal and EOM are normal. Pupils are equal, round, and reactive to light.  Neck: Normal range of motion. Neck supple. No JVD present. No tracheal deviation present. No thyromegaly present.  Cardiovascular: Normal rate, regular rhythm and normal heart sounds. Exam reveals no gallop and no friction rub.  No murmur heard.  Respiratory: Breath sounds normal. She is in respiratory distress. She has no wheezes. She has no rales. She exhibits no tenderness.  GI: Soft. Bowel sounds are normal. She exhibits no distension. There is no tenderness.  Musculoskeletal:R thumb with PIP tenderness and educed ROM, no erythema, no numbness Lymphadenopathy:  She has no cervical adenopathy.  Neurological: She is alert and oriented to person, place, and  time.  Low volume, somewhat dysarthric speech. Reasonable insight and awaeness.   Mild left facial weakness. 1/2 sensation left face, arm, leg. Left deltoid, bicep, tricep--1 to 1+, absent wrist and hand, HF 2+, KE and KF - 3, absent ADF and APF. DTR's 2+ right, trace on left. cognitvely he's intact.  Skin: Skin is warm and dry.  Psychiatric: She has a normal mood and affect. Her behavior is normal. Judgment and thought content normal.    Assessment/Plan: 1. Functional deficits secondary to thrombotic right pontine infarct which require 3+ hours per day of interdisciplinary therapy in a comprehensive inpatient rehab setting. Physiatrist is providing close team supervision and 24 hour management of active medical problems listed below. Physiatrist and rehab team continue to assess barriers to discharge/monitor patient progress toward functional and medical goals. FIM: FIM - Bathing Bathing Steps Patient Completed: Chest;Right Arm;Left Arm;Abdomen;Front perineal area;Buttocks;Right upper leg;Left upper leg;Right lower leg (including foot);Left lower leg (including foot) Bathing: 4: Min-Patient completes 8-9 23f 10 parts or 75+ percent  FIM - Upper Body Dressing/Undressing Upper body dressing/undressing steps patient completed: Thread/unthread right sleeve of pullover shirt/dresss;Thread/unthread left sleeve of pullover shirt/dress;Put head through opening of pull over shirt/dress;Pull shirt over trunk Upper body dressing/undressing: 5: Set-up assist to: Obtain clothing/put away FIM - Lower Body Dressing/Undressing Lower body dressing/undressing steps patient completed: Thread/unthread right underwear leg;Thread/unthread left underwear leg;Pull underwear up/down;Thread/unthread right pants leg;Thread/unthread left pants leg;Pull pants up/down;Don/Doff right sock;Don/Doff left sock;Don/Doff right shoe;Don/Doff left shoe;Fasten/unfasten right shoe;Fasten/unfasten left shoe Lower body  dressing/undressing: 4: Steadying Assist  FIM - Toileting Toileting steps completed by patient: Adjust clothing prior to toileting;Performs perineal hygiene;Adjust clothing  after toileting Toileting Assistive Devices: Grab bar or rail for support Toileting: 4: Steadying assist  FIM - Diplomatic Services operational officer Devices: Grab bars Toilet Transfers: 4-To toilet/BSC: Min A (steadying Pt. > 75%);4-From toilet/BSC: Min A (steadying Pt. > 75%)  FIM - Bed/Chair Transfer Bed/Chair Transfer Assistive Devices: HOB elevated;Bed rails;Arm rests Bed/Chair Transfer: 5: Supine > Sit: Supervision (verbal cues/safety issues);5: Sit > Supine: Supervision (verbal cues/safety issues);4: Bed > Chair or W/C: Min A (steadying Pt. > 75%);4: Chair or W/C > Bed: Min A (steadying Pt. > 75%)  FIM - Locomotion: Wheelchair Locomotion: Wheelchair: 1: Total Assistance/staff pushes wheelchair (Pt<25%) FIM - Locomotion: Ambulation Locomotion: Ambulation Assistive Devices: Walker - Rolling (L hand splint) Ambulation/Gait Assistance: 4: Min assist Locomotion: Ambulation: 2: Travels 50 - 149 ft with minimal assistance (Pt.>75%)  Comprehension Comprehension Mode: Auditory Comprehension: 6-Follows complex conversation/direction: With extra time/assistive device  Expression Expression Mode: Verbal Expression: 3-Expresses basic 50 - 74% of the time/requires cueing 25 - 50% of the time. Needs to repeat parts of sentences.  Social Interaction Social Interaction Mode: Asleep Social Interaction: 6-Interacts appropriately with others with medication or extra time (anti-anxiety, antidepressant).  Problem Solving Problem Solving Mode: Not assessed Problem Solving: 5-Solves basic 90% of the time/requires cueing < 10% of the time  Memory Memory Mode: Not assessed Memory: 6-Assistive device: No helper  Medical Problem List and Plan:  1. DVT Prophylaxis/Anticoagulation: Pharmaceutical: Lovenox  2. Pain  Management: N/A at thsi point 3. Mood: team providing positive reinforcement and encouragement 4. HTN: Recent diagnosis. Monitoring with bid checks.   Renal status nl.  - Education regarding BP control in the context of stroke secondary prevention.  5. Dyslipidemia: continue zocor for management  LOS (Days) 6 A FACE TO FACE EVALUATION WAS PERFORMED  Erick Colace 05/29/2012 8:13 AM

## 2012-05-29 NOTE — Progress Notes (Addendum)
Physical Therapy Note  Patient Details  Name: ULRICH SOULES MRN: [id number] Date of Birth: 1946-01-04 Today's Date: 05/29/2012  1550-1620 (30 minutes) individual Pain: no reported pain Focus of treatment: Neuro re-ed LT LE; gait training with RW Treatment: transfers SBA stand/pivot with unilateral UE support; sit to supine/supine to sit SBA; Neuro re-ed Lt LE X 20 - heel slides with tactile cues to prevent hip ER and focus on eccentric control; sidelying AA hip abduction, bridging , hip ER/IR in hooklying; up/down 6 inch step LT LE for quad /hip control X 1; stepping on/off 6 inch step LT LE for hip/ankle control ; gait 80 feet X 2 RW with hand splint SBA with min assist X 1 for balance with foot drag during turns.   Lillian Ballester,JIM 05/29/2012, 7:44 AM

## 2012-05-30 ENCOUNTER — Inpatient Hospital Stay (HOSPITAL_COMMUNITY): Payer: Medicare Other | Admitting: Physical Therapy

## 2012-05-30 ENCOUNTER — Inpatient Hospital Stay (HOSPITAL_COMMUNITY): Payer: Medicare Other

## 2012-05-30 ENCOUNTER — Inpatient Hospital Stay (HOSPITAL_COMMUNITY): Payer: Medicare Other | Admitting: *Deleted

## 2012-05-30 ENCOUNTER — Inpatient Hospital Stay (HOSPITAL_COMMUNITY): Payer: Medicare Other | Admitting: Speech Pathology

## 2012-05-30 NOTE — Progress Notes (Signed)
Physical Therapy Weekly Progress Note  Patient Details  Name: Dustin West MRN: [id number] Date of Birth: [birth year]  Today's Date: 05/30/2012 Time: 1115-1200 Time Calculation (min): 45 min  Patient has met 3 of 4 short term goals.  Patient is ambulating up to 250 feet but requires occasional min to mod assist due to loss of balance.   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  Patient progressing toward long term goals..  Continue plan of care.  PT Short Term Goals Week 1:  PT Short Term Goal 1 (Week 1): Patient will be able to perform bed mobility with S/Mod-I PT Short Term Goal 1 - Progress (Week 1): Met PT Short Term Goal 2 (Week 1): Patient will be able to perform transfers with S/Mod-I PT Short Term Goal 2 - Progress (Week 1): Met PT Short Term Goal 3 (Week 1): Patient will ambulate using LRAD x 150' in controlled environmnet with S/Mod-I PT Short Term Goal 3 - Progress (Week 1): Partly met PT Short Term Goal 4 (Week 1): Patient will ascend/descend 4 steps with hand rail with Mod-A  PT Short Term Goal 4 - Progress (Week 1): Met Week 2:  PT Short Term Goal 1 (Week 2): STG's = LTG's due to d/c in 1 week  Skilled Therapeutic Interventions/Progress Updates:  Patient propels wheelchair using right extremities 150+ feet in controlled environment. Patient used biodex to work on weight shifting to right and left as well as diagonal planes for 2.5 minutes in each direction. Patient ambulated with rolling walker with walker splint 120 feet x 1, 140 feet x 1, and 250 feet x 1. Patient is close to supervision however will occasionally not clear left LE during swing causing LOB and min to mod assist to regain.   Therapy  Documentation Precautions:  Precautions Precautions: Fall Precaution Comments: left knee buckles  Required Braces or Orthoses: Other Brace/Splint Other Brace/Splint: R hand thumb immobilizer started today for R thumb DIP pain  Restrictions Weight Bearing Restrictions: No  Pain: Pain Assessment Pain Assessment: 0-10 Pain Score:   5 Pain Location: Finger (Comment which one) (thumb) Pain Orientation: Right  Locomotion : Ambulation Ambulation/Gait Assistance: 4: Min assist   See FIM for current functional status  Therapy/Group: Individual Therapy  Alma Friendly 05/30/2012, 3:46 PM

## 2012-05-30 NOTE — Progress Notes (Signed)
Brief Nutrition Note:  RD pulled to pt for Malnutrition Screening Tool score of 2. Pt denied unintentional weight loss or poor appetite, but "Unknown amount" was indicated generating a score of 2.  Pt is eating >/=75% most meals, regular diet.  Body mass index is 21.20 kg/(m^2). WNL  Chart reviewed, no nutrition interventions warranted at this time. Please consult as needed.  Clarene Duke RD, LDN Pager [phone] After Hours pager [phone]

## 2012-05-30 NOTE — Progress Notes (Signed)
Occupational Therapy Session Note  Patient Details  Name: Dustin West MRN: [id number] Date of Birth: 08-Feb-1946  Today's Date: 05/30/2012 Time: 1027-2536  Time Calculation (min): 60 min   Short Term Goals: Week 1:  OT Short Term Goal 1 (Week 1): Pt. will bathe UB with minimal assist ( ) OT Short Term Goal 2 (Week 1): Pt. will bathe LB with minimal assist OT Short Term Goal 3 (Week 1): Pt.  will go from sit to stand with supervison  Skilled Therapeutic Interventions/Progress Updates:   ADL re-training session completed in walk-in shower this AM. Session with focus on standing balance, ADL performance, functional transfers, and safety awareness. Patient used walker to walk from w/c to toilet to under. Patient then walked with HHA from toilet to shower. Patient needed mod vc's for safety awareness and transfers during sit to stands, and transfers. Patient transferred into bed and completed NM re-ed exercises. See exercises below.   Therapy Documentation Precautions:  Precautions Precautions: Fall Precaution Comments: left knee buckles Required Braces or Orthoses: Other Brace/Splint Other Brace/Splint: R hand thumb immobilizer started today for R thumb DIP pain Restrictions Weight Bearing Restrictions: No Pain: Pain Assessment Pain Assessment: No/denies pain    05/30/12 1000  General Exercises - Upper Extremity  Shoulder Flexion AAROM;Left;5 reps;Supine  Shoulder Extension AAROM;Left;5 reps;Supine  Shoulder ABduction AAROM;Both;5 reps;Supine  Shoulder ADduction AAROM;Left;5 reps;Supine (with tapping to faciliate muscle )  Elbow Flexion AAROM;Left;5 reps;Supine (with tapping to facilitate muscle)  Elbow Extension AAROM;Left;5 reps;Supine (with tapping to faciliate muscle )    See FIM for current functional status  Therapy/Group: Individual Therapy  [name], OTR/L 05/30/2012, 10:55 AM

## 2012-05-30 NOTE — Progress Notes (Signed)
Physical Therapy Note  Patient Details  Name: Dustin West MRN: [id number] Date of Birth: February 22, 1946 Today's Date: 05/30/2012  Time: 800-845 45 minutes  No c/o pain.  Standing balance training for dressing and toileting tasks with min A without UE support during donning pants/hygiene, supervision when able to use R UE for support.  Gait training with RW with min A, cuing to prevent L LE hyperextension, cues for step through gait pattern.  Pt with no LOB but requires manual facilitation for anterior wt shift of L hip.  Stair training with HHA, mod A x 3 stairs with cuing for technique and safety.  Stair training 2 x 3 stairs with 1 railing focus on maintaining balance with descending stairs, pt requires min-mod A for balance on stairs.  Tall kneeling focus on L hip strength and endurance with wt shifts and reaching activities, pt fatigues quickly but able to perform tall kneeling, unable to perform half kneeling during this session due to fatigue.  Individual therapy   [name] 05/30/2012, 8:45 AM

## 2012-05-30 NOTE — Progress Notes (Signed)
Speech Language Pathology Daily Session Note & Weekly Progress Summary  Patient Details  Name: Dustin West MRN: [id number] Date of Birth: 1945/12/28  Today's Date: 05/30/2012 Time: 1030-1100 Time Calculation (min): 30 min  Short Term Goals: Week 1: SLP Short Term Goal 1 (Week 1): Patient will perform diaphragmatic breathing exercises with supervision level veral cues SLP Short Term Goal 1 - Progress (Week 1): Progressing toward goal SLP Short Term Goal 2 (Week 1): Patient will perform oral motor exercises with supervision level veral cues SLP Short Term Goal 2 - Progress (Week 1): Partly met SLP Short Term Goal 3 (Week 1): Patient will identify 2 compensatory strategies that improve speech intelligibility SLP Short Term Goal 3 - Progress (Week 1): Progressing toward goal SLP Short Term Goal 4 (Week 1): Patient will utilize speech intelligibility strategies during structured activities at the sentence-conversational level with min assist verbal/non-verbal cues SLP Short Term Goal 4 - Progress (Week 1): Progressing toward goal  Skilled Therapeutic Interventions: Skilled treatment session focused on addressing speech intelligibility.  SLP facilitated session with door open and TV on to demonstrate environment's impact and SLP provided mod assist verbal and visual cues to assist with patient self-monitoring rate and vocal intensity during conversation.  Patient was able to utilize external aids to verbalize strategies but demonstrated difficulty carryover into function.  SLP reduced environmental distractions and after instructional cues for posture and strategies patient only required min assist cue throughout the rest of the session.  Patient reports performing oral motor exercises between therapy session and continued frustration with family not understanding him over the phone.     FIM:  Comprehension Comprehension Mode: Auditory Comprehension: 6-Follows complex conversation/direction:  With extra time/assistive device Expression Expression Mode: Verbal Expression: 4-Expresses basic 75 - 89% of the time/requires cueing 10 - 24% of the time. Needs helper to occlude trach/needs to repeat words. Social Interaction Social Interaction: 6-Interacts appropriately with others with medication or extra time (anti-anxiety, antidepressant). Problem Solving Problem Solving: 6-Solves complex problems: With extra time Memory Memory: 6-Assistive device: No helper FIM - Eating Eating Activity: 7: Complete independence:no helper  Pain Pain Assessment Pain Assessment: No/denies pain  Therapy/Group: Individual Therapy   Speech Language Pathology Weekly Progress Note  Patient Details  Name: Dustin West MRN: [id number] Date of Birth: 06-08-1945  Today's Date: 05/30/2012  Short Term Goals: Week 1: SLP Short Term Goal 1 (Week 1): Patient will perform diaphragmatic breathing exercises with supervision level veral cues SLP Short Term Goal 1 - Progress (Week 1): Met SLP Short Term Goal 2 (Week 1): Patient will perform oral motor exercises with supervision level veral cues SLP Short Term Goal 2 - Progress (Week 1): Met SLP Short Term Goal 3 (Week 1): Patient will identify 2 compensatory strategies that improve speech intelligibility SLP Short Term Goal 3 - Progress (Week 1): Met SLP Short Term Goal 4 (Week 1): Patient will utilize speech intelligibility strategies during structured activities at the sentence-conversational level with min assist verbal/non-verbal cues SLP Short Term Goal 4 - Progress (Week 1): Met Week 2: SLP Short Term Goal 1 (Week 2): Patient will utilize speech intelligibility strategies during conversation at the sentence-conversational level with supervision level verbal/non-verbal cues SLP Short Term Goal 2 (Week 2): Patient will self monitor and correct errors as well as commuication environment with supervision level verbal cues.  Weekly Progress  Updates: Patient met 4 out of 4 short term objectives this reporting period due to carryover of provided and taught oral motor  and diaphragmatic breathing exercises, speech intelligibility strategies and use of strategies during structured activities.  Patient continues to require min-mod assist cues depending on the type of environment with difficulty self-monitoring and correcting as needed.  As a result, this patient would continue to benefit from skilled SLP services to maximize functional independence and reduce amount of cues/burden of care upon discharge home with son.     SLP Intensity: Minumum of 1-2 x/day, 30 to 90 minutes SLP Frequency: 3 out of 7 days SLP Duration/Estimated Length of Stay: 1 week  SLP Treatment/Interventions: Cueing hierarchy;Environmental controls;Functional tasks;Internal/external aids;Oral motor exercises;Patient/family education;Speech/Language facilitation;Therapeutic Activities  Charlane Ferretti., CCC-SLP [phone]  Jamez Ambrocio 05/30/2012, 1:47 PM

## 2012-05-30 NOTE — Progress Notes (Signed)
Patient ID: Dustin West, male   DOB: 1946-02-10, 67 y.o.   MRN: [id number] Subjective/Complaints: R thumb pain with use, says that spica made pain worse A 12 point review of systems has been performed and if not noted above is otherwise negative.  Objective: Vital Signs: Blood pressure 111/60, pulse 63, temperature 97.8 F (36.6 C), temperature source Oral, resp. rate 18, height 5\' 6"  (1.676 m), weight 59.575 kg (131 lb 5.4 oz), SpO2 96.00%. Dg Finger Thumb Right  05/29/2012  *RADIOLOGY REPORT*  Clinical Data: Fall  RIGHT THUMB 2+V  Comparison: None.  Findings: No acute fracture and no dislocation.  IMPRESSION: No acute bony pathology.   Original Report Authenticated By: Jolaine Click, M.D.    No results found for this basename: WBC:2,HGB:2,HCT:2,PLT:2 in the last 72 hours No results found for this basename: NA:2,K:2,CL:2,CO:2,GLUCOSE:2,BUN:2,CREATININE:2,CALCIUM:2 in the last 72 hours CBG (last 3)  No results found for this basename: GLUCAP:3 in the last 72 hours  Wt Readings from Last 3 Encounters:  05/28/12 59.575 kg (131 lb 5.4 oz)  05/23/12 70.761 kg (156 lb)    Physical Exam:  Constitutional: She is oriented to person, place, and time. She appears well-developed and well-nourished.  HENT:  Head: Normocephalic and atraumatic.  Right Ear: External ear normal.  Left Ear: External ear normal.  Nose: Nose normal.  Mouth/Throat: Oropharynx is clear and moist.  Eyes: Conjunctivae normal and EOM are normal. Pupils are equal, round, and reactive to light.  Neck: Normal range of motion. Neck supple. No JVD present. No tracheal deviation present. No thyromegaly present.  Cardiovascular: Normal rate, regular rhythm and normal heart sounds. Exam reveals no gallop and no friction rub.  No murmur heard.  Respiratory: Breath sounds normal. She is in respiratory distress. She has no wheezes. She has no rales. She exhibits no tenderness.  GI: Soft. Bowel sounds are normal. She exhibits no  distension. There is no tenderness.  Musculoskeletal:R thumb with PIP tenderness and educed ROM,no directional pain, no erythema, no numbness Lymphadenopathy:  She has no cervical adenopathy.  Neurological: She is alert and oriented to person, place, and time.  Low volume, somewhat dysarthric speech. Reasonable insight and awaeness.   Mild left facial weakness. 1/2 sensation left face, arm, leg. Left deltoid, bicep, tricep--1 to 1+, absent wrist and hand, HF 2+, KE and KF - 3, absent ADF and APF. DTR's 2+ right, trace on left. cognitvely he's intact.  Skin: Skin is warm and dry.  Psychiatric: She has a normal mood and affect. Her behavior is normal. Judgment and thought content normal.    Assessment/Plan: 1. Functional deficits secondary to thrombotic right pontine infarct which require 3+ hours per day of interdisciplinary therapy in a comprehensive inpatient rehab setting. Physiatrist is providing close team supervision and 24 hour management of active medical problems listed below. Physiatrist and rehab team continue to assess barriers to discharge/monitor patient progress toward functional and medical goals. FIM: FIM - Bathing Bathing Steps Patient Completed: Chest;Right Arm;Left Arm;Abdomen;Front perineal area;Buttocks;Right upper leg;Left upper leg;Right lower leg (including foot);Left lower leg (including foot) Bathing: 4: Min-Patient completes 8-9 15f 10 parts or 75+ percent  FIM - Upper Body Dressing/Undressing Upper body dressing/undressing steps patient completed: Thread/unthread right sleeve of pullover shirt/dresss;Thread/unthread left sleeve of pullover shirt/dress;Put head through opening of pull over shirt/dress;Pull shirt over trunk Upper body dressing/undressing: 5: Set-up assist to: Obtain clothing/put away FIM - Lower Body Dressing/Undressing Lower body dressing/undressing steps patient completed: Thread/unthread right underwear leg;Thread/unthread left underwear leg;Pull  underwear up/down;Thread/unthread right pants leg;Thread/unthread left pants leg;Pull pants up/down;Don/Doff right sock;Don/Doff left sock;Don/Doff right shoe;Don/Doff left shoe;Fasten/unfasten right shoe;Fasten/unfasten left shoe Lower body dressing/undressing: 4: Min-Patient completed 75 plus % of tasks  FIM - Toileting Toileting steps completed by patient: Adjust clothing prior to toileting;Performs perineal hygiene;Adjust clothing after toileting Toileting Assistive Devices: Grab bar or rail for support Toileting: 0: Activity did not occur  FIM - Diplomatic Services operational officer Devices: Grab bars Toilet Transfers: 0-Activity did not occur  FIM - Banker Devices: HOB elevated;Bed rails;Arm rests Bed/Chair Transfer: 0: Activity did not occur  FIM - Locomotion: Wheelchair Locomotion: Wheelchair: 0: Activity did not occur FIM - Locomotion: Ambulation Locomotion: Ambulation Assistive Devices: Walker - Rolling (L hand splint) Ambulation/Gait Assistance: 4: Min assist Locomotion: Ambulation: 0: Activity did not occur  Comprehension Comprehension Mode: Auditory Comprehension: 6-Follows complex conversation/direction: With extra time/assistive device  Expression Expression Mode: Verbal Expression: 4-Expresses basic 75 - 89% of the time/requires cueing 10 - 24% of the time. Needs helper to occlude trach/needs to repeat words.  Social Interaction Social Interaction Mode: Asleep Social Interaction: 6-Interacts appropriately with others with medication or extra time (anti-anxiety, antidepressant).  Problem Solving Problem Solving Mode: Not assessed Problem Solving: 5-Solves basic 90% of the time/requires cueing < 10% of the time  Memory Memory Mode: Not assessed Memory: 6-Assistive device: No helper  Medical Problem List and Plan:  1. DVT Prophylaxis/Anticoagulation: Pharmaceutical: Lovenox  2. Pain Management: Xray negative,  pain is likely soft tissue, possibly volar plate 3. Mood: team providing positive reinforcement and encouragement 4. HTN: Recent diagnosis. Monitoring with bid checks.   Renal status nl.  - Education regarding BP control in the context of stroke secondary prevention.  5. Dyslipidemia: continue zocor for management  LOS (Days) 7 A FACE TO FACE EVALUATION WAS PERFORMED  Rosey Eide E 05/30/2012 8:10 AM

## 2012-05-30 NOTE — Progress Notes (Signed)
Physical Therapy Session Note  Patient Details  Name: Dustin West MRN: [id number] Date of Birth: 1946/04/07  Today's Date: 05/30/2012 Time: 1530-1600 Time Calculation (min): 30 min  Short Term Goals: Week 1:  PT Short Term Goal 1 (Week 1): Patient will be able to perform bed mobility with S/Mod-I PT Short Term Goal 1 - Progress (Week 1): Met PT Short Term Goal 2 (Week 1): Patient will be able to perform transfers with S/Mod-I PT Short Term Goal 2 - Progress (Week 1): Met PT Short Term Goal 3 (Week 1): Patient will ambulate using LRAD x 150' in controlled environmnet with S/Mod-I PT Short Term Goal 3 - Progress (Week 1): Partly met PT Short Term Goal 4 (Week 1): Patient will ascend/descend 4 steps with hand rail with Mod-A  PT Short Term Goal 4 - Progress (Week 1): Met  Skilled Therapeutic Interventions/Progress Updates:    Today's session focused on gait training and NMR of L LE. Patient received sitting in wheelchair. Patient propels wheelchair 100' with supervision. Patient instructed in gait training x174' with rolling walker and L hand orthosis and min assist, see details below. Patient performed steps ups with R handrail and min assist, ascending forwards with L LE and descending backwards with R LE with emphasis on eccentric L quad control. Patient performed 2 sets x10 repetitions of sit<>stand from wheelchair with R LE positioned slightly anterior to L LE to facilitate increased weight bearing and strengthening of L LE. Patient propelled wheelchair using only L LE to facilitate increase L LE strengthening.  Patient returned to room and left seated in wheelchair with all needs within reach.  Therapy Documentation Precautions:  Precautions Precautions: Fall Precaution Comments: left knee buckles  Required Braces or Orthoses: Other Brace/Splint Other Brace/Splint: R hand thumb immobilizer started today for R thumb DIP pain  Restrictions Weight Bearing Restrictions:  No Pain: Pain Assessment Pain Assessment: 0-10 Pain Score:   5 Pain Location: Finger (Comment which one) (thumb) Pain Orientation: Right Mobility: Transfers Sit to Stand: 4: Min assist;From chair/3-in-1;With armrests;With upper extremity assist Sit to Stand Details: Verbal cues for sequencing;Verbal cues for technique;Verbal cues for precautions/safety Stand to Sit: 4: Min assist;To chair/3-in-1;With armrests;With upper extremity assist Stand to Sit Details (indicate cue type and reason): Verbal cues for technique;Verbal cues for precautions/safety;Verbal cues for sequencing Stand to Sit Details: Uncontrolled descent Locomotion : Ambulation Ambulation: Yes Ambulation/Gait Assistance: 4: Min assist Ambulation Distance (Feet): 174 Feet Assistive device: Rolling walker;Other (Comment) (with L hand orthosis) Ambulation/Gait Assistance Details: Verbal cues for gait pattern;Verbal cues for precautions/safety;Tactile cues for weight shifting;Manual facilitation for weight shifting Gait Gait: Yes Gait Pattern: Impaired Gait Pattern: Narrow base of support;Trunk flexed;Step-through pattern;Decreased stance time - left;Decreased step length - left;Left foot flat (poor L foot placement; intermittent L knee buckle) Stairs / Additional Locomotion Stairs: Yes Stairs Assistance: 4: Min assist Stairs Assistance Details: Verbal cues for sequencing;Verbal cues for technique;Verbal cues for precautions/safety Stairs Assistance Details (indicate cue type and reason): Patient negotiated 10 steps with min assist, ascending forwards leading with L LE, and descending backwards leading with R LE to emphasize L LE strengthening, weight bearing, and L quad eccentric control. Stair Management Technique: One rail Right;Forwards;Step to pattern Number of Stairs: 10  Height of Stairs: 6  Wheelchair Mobility Wheelchair Mobility: Yes Wheelchair Assistance: 5: Supervision Wheelchair Propulsion: Right upper  extremity;Right lower extremity Wheelchair Parts Management: Needs assistance Distance: 100   See FIM for current functional status  Therapy/Group: Individual Therapy  Clarisse Gouge  S Tanetta Fuhriman S. Truda Staub, PT, DPT  05/30/2012, 4:15 PM

## 2012-05-31 ENCOUNTER — Inpatient Hospital Stay (HOSPITAL_COMMUNITY): Payer: Medicare Other | Admitting: Physical Therapy

## 2012-05-31 NOTE — Progress Notes (Signed)
Physical Therapy Note  Patient Details  Name: Dustin West MRN: [id number] Date of Birth: [birth year] Today's Date: 05/31/2012  1300-1355 (55 minutes) group Pain: no reported pain Pt participated in PT group session focused on gait training/safety/endurance. Pt ambulates 160 feet X 3 with RW + hand splint left min to SBA for safety + tactile cues at right shoulder/left hip to facilitate improved left hip extension in stance.    Jasman Pfeifle,JIM 05/31/2012, 7:54 AM

## 2012-05-31 NOTE — Progress Notes (Signed)
Patient ID: Dustin West, male   DOB: Jul 08, 1945, 67 y.o.   MRN: [id number] Subjective/Complaints: R thumb pain with bending doesn't like spica but it doesn't bother him when I tried it on him A 12 point review of systems has been performed and if not noted above is otherwise negative.  Objective: Vital Signs: Blood pressure 99/59, pulse 71, temperature 98 F (36.7 C), temperature source Oral, resp. rate 17, height 5\' 6"  (1.676 m), weight 59.575 kg (131 lb 5.4 oz), SpO2 93.00%. Dg Finger Thumb Right  05/29/2012  *RADIOLOGY REPORT*  Clinical Data: Fall  RIGHT THUMB 2+V  Comparison: None.  Findings: No acute fracture and no dislocation.  IMPRESSION: No acute bony pathology.   Original Report Authenticated By: Jolaine Click, M.D.    No results found for this basename: WBC:2,HGB:2,HCT:2,PLT:2 in the last 72 hours No results found for this basename: NA:2,K:2,CL:2,CO:2,GLUCOSE:2,BUN:2,CREATININE:2,CALCIUM:2 in the last 72 hours CBG (last 3)  No results found for this basename: GLUCAP:3 in the last 72 hours  Wt Readings from Last 3 Encounters:  05/28/12 59.575 kg (131 lb 5.4 oz)  05/23/12 70.761 kg (156 lb)    Physical Exam:  Constitutional: She is oriented to person, place, and time. She appears well-developed and well-nourished.  HENT:  Head: Normocephalic and atraumatic.  Right Ear: External ear normal.  Left Ear: External ear normal.  Nose: Nose normal.  Mouth/Throat: Oropharynx is clear and moist.  Eyes: Conjunctivae normal and EOM are normal. Pupils are equal, round, and reactive to light.  Neck: Normal range of motion. Neck supple. No JVD present. No tracheal deviation present. No thyromegaly present.  Cardiovascular: Normal rate, regular rhythm and normal heart sounds. Exam reveals no gallop and no friction rub.  No murmur heard.  Respiratory: Breath sounds normal. She is in respiratory distress. She has no wheezes. She has no rales. She exhibits no tenderness.  GI: Soft. Bowel  sounds are normal. She exhibits no distension. There is no tenderness.  Musculoskeletal:R thumb with PIP tenderness and reduced ROM,no directional pain, no erythema, no numbness, mild 1st MCP tenderness Lymphadenopathy:  She has no cervical adenopathy.  Neurological: She is alert and oriented to person, place, and time.  Low volume, somewhat dysarthric speech. Reasonable insight and awaeness.   Mild left facial weakness. 1/2 sensation left face, arm, leg. Left deltoid, bicep, tricep--1 to 1+, absent wrist and hand, HF 2+, KE and KF - 3, absent ADF and APF. DTR's 2+ right, trace on left. cognitvely he's intact.  Skin: Skin is warm and dry.  Psychiatric: She has a normal mood and affect. Her behavior is normal. Judgment and thought content normal.    Assessment/Plan: 1. Functional deficits secondary to thrombotic right pontine infarct which require 3+ hours per day of interdisciplinary therapy in a comprehensive inpatient rehab setting. Physiatrist is providing close team supervision and 24 hour management of active medical problems listed below. Physiatrist and rehab team continue to assess barriers to discharge/monitor patient progress toward functional and medical goals. FIM: FIM - Bathing Bathing Steps Patient Completed: Chest;Right Arm;Left Arm;Abdomen;Front perineal area;Buttocks;Right upper leg;Left upper leg;Right lower leg (including foot);Left lower leg (including foot) Bathing: 4: Min-Patient completes 8-9 42f 10 parts or 75+ percent  FIM - Upper Body Dressing/Undressing Upper body dressing/undressing steps patient completed: Thread/unthread right sleeve of pullover shirt/dresss;Thread/unthread left sleeve of pullover shirt/dress;Put head through opening of pull over shirt/dress;Pull shirt over trunk Upper body dressing/undressing: 5: Supervision: Safety issues/verbal cues FIM - Lower Body Dressing/Undressing Lower body dressing/undressing steps  patient completed: Thread/unthread right  underwear leg;Thread/unthread left underwear leg;Pull underwear up/down;Thread/unthread right pants leg;Thread/unthread left pants leg;Pull pants up/down;Don/Doff right sock;Don/Doff left sock Lower body dressing/undressing: 4: Steadying Assist  FIM - Toileting Toileting steps completed by patient: Adjust clothing prior to toileting;Performs perineal hygiene;Adjust clothing after toileting Toileting Assistive Devices: Grab bar or rail for support Toileting: 0: Activity did not occur  FIM - Diplomatic Services operational officer Devices: Grab bars Toilet Transfers: 4-To toilet/BSC: Min A (steadying Pt. > 75%);4-From toilet/BSC: Min A (steadying Pt. > 75%)  FIM - Bed/Chair Transfer Bed/Chair Transfer Assistive Devices: Arm rests;Walker Bed/Chair Transfer: 4: Bed > Chair or W/C: Min A (steadying Pt. > 75%);4: Chair or W/C > Bed: Min A (steadying Pt. > 75%)  FIM - Locomotion: Wheelchair Distance: 100 Locomotion: Wheelchair: 2: Travels 50 - 149 ft with supervision, cueing or coaxing FIM - Locomotion: Ambulation Locomotion: Ambulation Assistive Devices: Walker - Rolling;Other (comment) (w/ L hand orthosis) Ambulation/Gait Assistance: 4: Min assist Locomotion: Ambulation: 4: Travels 150 ft or more with minimal assistance (Pt.>75%)  Comprehension Comprehension Mode: Auditory Comprehension: 6-Follows complex conversation/direction: With extra time/assistive device  Expression Expression Mode: Verbal Expression: 4-Expresses basic 75 - 89% of the time/requires cueing 10 - 24% of the time. Needs helper to occlude trach/needs to repeat words.  Social Interaction Social Interaction Mode: Asleep Social Interaction: 6-Interacts appropriately with others with medication or extra time (anti-anxiety, antidepressant).  Problem Solving Problem Solving Mode: Not assessed Problem Solving: 6-Solves complex problems: With extra time  Memory Memory Mode: Not assessed Memory: 6-Assistive device:  No helper  Medical Problem List and Plan:  1. DVT Prophylaxis/Anticoagulation: Pharmaceutical: Lovenox  2. Pain Management: Xray negative, pain is likely soft tissue, possibly volar plate 3. Mood: team providing positive reinforcement and encouragement 4. HTN: Recent diagnosis. Monitoring with bid checks.   Renal status nl.  - Education regarding BP control in the context of stroke secondary prevention.  5. Dyslipidemia: continue zocor for management  LOS (Days) 8 A FACE TO FACE EVALUATION WAS PERFORMED  Erick Colace 05/31/2012 10:38 AM

## 2012-06-01 ENCOUNTER — Inpatient Hospital Stay (HOSPITAL_COMMUNITY): Payer: Medicare Other | Admitting: Physical Therapy

## 2012-06-01 NOTE — Progress Notes (Signed)
Physical Therapy Note  Patient Details  Name: Dustin West MRN: [id number] Date of Birth: Apr 11, 1946 Today's Date: 06/01/2012  1330-1425 (55 minutes) group Pain: no complaint of pain Pt participated in PT group session focused on gait safety/training/endurance. Gait 80 feet X 2 RW + splint LT hand SBA; up/down 4 inch step Lt LE for hip/knee strengthening/control and hip flexion strengthening 2 X 15 ; gait 80 feet without assistive device min assist for increased weight bearing LT LE in stance.   Jennife Zaucha,JIM 06/01/2012, 7:36 AM

## 2012-06-01 NOTE — Progress Notes (Signed)
Patient ID: Dustin West, male   DOB: 1946-04-12, 67 y.o.   MRN: [id number] Subjective/Complaints: R thumb pain with bending doesn't like spica but it doesn't bother him when I tried it on him A 12 point review of systems has been performed and if not noted above is otherwise negative.  Objective: Vital Signs: Blood pressure 108/72, pulse 98, temperature 98.1 F (36.7 C), temperature source Oral, resp. rate 20, height 5\' 6"  (1.676 m), weight 59.575 kg (131 lb 5.4 oz), SpO2 93.00%. No results found. No results found for this basename: WBC:2,HGB:2,HCT:2,PLT:2 in the last 72 hours No results found for this basename: NA:2,K:2,CL:2,CO:2,GLUCOSE:2,BUN:2,CREATININE:2,CALCIUM:2 in the last 72 hours CBG (last 3)  No results found for this basename: GLUCAP:3 in the last 72 hours  Wt Readings from Last 3 Encounters:  05/28/12 59.575 kg (131 lb 5.4 oz)  05/23/12 70.761 kg (156 lb)    Physical Exam:  Constitutional: She is oriented to person, place, and time. She appears well-developed and well-nourished.  HENT:  Head: Normocephalic and atraumatic.  Right Ear: External ear normal.  Left Ear: External ear normal.  Nose: Nose normal.  Mouth/Throat: Oropharynx is clear and moist.  Eyes: Conjunctivae normal and EOM are normal. Pupils are equal, round, and reactive to light.  Neck: Normal range of motion. Neck supple. No JVD present. No tracheal deviation present. No thyromegaly present.  Cardiovascular: Normal rate, regular rhythm and normal heart sounds. Exam reveals no gallop and no friction rub.  No murmur heard.  Respiratory: Breath sounds normal. She is in respiratory distress. She has no wheezes. She has no rales. She exhibits no tenderness.  GI: Soft. Bowel sounds are normal. She exhibits no distension. There is no tenderness.  Musculoskeletal:R thumb with PIP tenderness and reduced ROM,no directional pain, no erythema, no numbness, mild 1st MCP tenderness Lymphadenopathy:  She has no  cervical adenopathy.  Neurological: She is alert and oriented to person, place, and time.  Low volume, somewhat dysarthric speech. Reasonable insight and awaeness.   Mild left facial weakness. 1/2 sensation left face, arm, leg. Left deltoid, bicep, tricep--1 to 1+, absent wrist and hand, HF 2+, KE and KF - 3, absent ADF and APF. DTR's 2+ right, trace on left. cognitvely he's intact.  Skin: Skin is warm and dry.  Psychiatric: She has a normal mood and affect. Her behavior is normal. Judgment and thought content normal.    Assessment/Plan: 1. Functional deficits secondary to thrombotic right pontine infarct which require 3+ hours per day of interdisciplinary therapy in a comprehensive inpatient rehab setting. Physiatrist is providing close team supervision and 24 hour management of active medical problems listed below. Physiatrist and rehab team continue to assess barriers to discharge/monitor patient progress toward functional and medical goals. FIM: FIM - Bathing Bathing Steps Patient Completed: Chest;Right Arm;Left Arm;Abdomen;Front perineal area;Buttocks;Right upper leg;Left upper leg;Right lower leg (including foot);Left lower leg (including foot) Bathing: 4: Min-Patient completes 8-9 14f 10 parts or 75+ percent  FIM - Upper Body Dressing/Undressing Upper body dressing/undressing steps patient completed: Thread/unthread right sleeve of pullover shirt/dresss;Thread/unthread left sleeve of pullover shirt/dress;Put head through opening of pull over shirt/dress;Pull shirt over trunk Upper body dressing/undressing: 5: Supervision: Safety issues/verbal cues FIM - Lower Body Dressing/Undressing Lower body dressing/undressing steps patient completed: Thread/unthread right underwear leg;Thread/unthread left underwear leg;Pull underwear up/down;Thread/unthread right pants leg;Thread/unthread left pants leg;Pull pants up/down;Don/Doff right sock;Don/Doff left sock Lower body dressing/undressing: 4:  Steadying Assist  FIM - Toileting Toileting steps completed by patient: Adjust clothing prior to  toileting;Performs perineal hygiene;Adjust clothing after toileting Toileting Assistive Devices: Grab bar or rail for support Toileting: 0: Activity did not occur  FIM - Diplomatic Services operational officer Devices: Grab bars Toilet Transfers: 6-Assistive device: No helper  FIM - Banker Devices: Therapist, occupational: 5: Chair or W/C > Bed: Supervision (verbal cues/safety issues);5: Bed > Chair or W/C: Supervision (verbal cues/safety issues)  FIM - Locomotion: Wheelchair Distance: 100 Locomotion: Wheelchair: 2: Travels 50 - 149 ft with supervision, cueing or coaxing FIM - Locomotion: Ambulation Locomotion: Ambulation Assistive Devices: Walker - Rolling;Other (comment) (w/ L hand orthosis) Ambulation/Gait Assistance: 4: Min assist Locomotion: Ambulation: 4: Travels 150 ft or more with minimal assistance (Pt.>75%)  Comprehension Comprehension Mode: Auditory Comprehension: 6-Follows complex conversation/direction: With extra time/assistive device  Expression Expression Mode: Verbal Expression Assistive Devices: 6-Communication board Expression: 4-Expresses basic 75 - 89% of the time/requires cueing 10 - 24% of the time. Needs helper to occlude trach/needs to repeat words.  Social Interaction Social Interaction Mode: Asleep Social Interaction: 6-Interacts appropriately with others with medication or extra time (anti-anxiety, antidepressant).  Problem Solving Problem Solving Mode: Not assessed Problem Solving: 5-Solves complex 90% of the time/cues < 10% of the time  Memory Memory Mode: Not assessed Memory: 6-More than reasonable amt of time  Medical Problem List and Plan:  1. DVT Prophylaxis/Anticoagulation: Pharmaceutical: Lovenox  2. Pain Management: Xray negative, pain is likely soft tissue, possibly volar plate 3. Mood: team  providing positive reinforcement and encouragement 4. HTN: Recent diagnosis. Monitoring with bid checks.   Renal status nl.  - Education regarding BP control in the context of stroke secondary prevention.  5. Dyslipidemia: continue zocor for management 6.  R thumb pain, probable tendinitis, trial voltaren gel.  Will need to keep thumb spica on for 7 days LOS (Days) 9 A FACE TO FACE EVALUATION WAS PERFORMED  Erick Colace 06/01/2012 9:39 AM

## 2012-06-01 NOTE — Progress Notes (Signed)
Patient removes RUE splint. Complains of some discomfort after wearing for a while. Difficult to understand at times. Dustin West A

## 2012-06-02 ENCOUNTER — Inpatient Hospital Stay (HOSPITAL_COMMUNITY): Payer: Medicare Other

## 2012-06-02 ENCOUNTER — Inpatient Hospital Stay (HOSPITAL_COMMUNITY): Payer: Medicare Other | Admitting: Occupational Therapy

## 2012-06-02 ENCOUNTER — Inpatient Hospital Stay (HOSPITAL_COMMUNITY): Payer: Medicare Other | Admitting: Speech Pathology

## 2012-06-02 DIAGNOSIS — I69921 Dysphasia following unspecified cerebrovascular disease: Secondary | ICD-10-CM

## 2012-06-02 DIAGNOSIS — I6330 Cerebral infarction due to thrombosis of unspecified cerebral artery: Secondary | ICD-10-CM

## 2012-06-02 NOTE — Progress Notes (Addendum)
Physical Therapy Session Note  Patient Details  Name: Dustin West MRN: [id number] Date of Birth: 11/23/1945  Today's Date: 06/02/2012 Time:0802-0903 61 min  -     Short Term Goals: Week 2:  PT Short Term Goal 1 (Week 2): STG's = LTG's due to d/c in 1 week  Skilled Therapeutic Interventions/Progress Updates:   Dynamic sitting and standing balance while pt donned pants and shirt with supervision; shoes with min assist.  Pt stood up impulsively and unsafely without close supervision, with footrests in front of w/c, attempting to get RW which was 4' to his R.  Pt did not seem to think this was unsafe, despite discussing it. Info relayed to team about impulsivity and unsafe behavior.  neuromuscular re-education via forced use, VCs, tactile cues for: -LLE stance control during bil modified tandem mini squats, calf raises -gait with Rw in obstacle course x 64' with stand by assist, and without RW with min assist, including weaving between cones and retrieving items from the floor with R hand, focusing on upright posture, forward gaze, L knee control -gait up/down 5 steps without rail (as per home situation) x 2 using wall on R to steady, min assist to mod assist ; mod VCs for correct sequence -in sitting with feet supported, pt used LUE gross shoulder abduction and L shoulder adduction to slide items on tabletop, focusing on relaxed trunk and shoulder elevators     Therapy Documentation Precautions:  Precautions Precautions: Fall Precaution Comments: left knee buckles  Required Braces or Orthoses: Other Brace/Splint Other Brace/Splint: R hand thumb immobilizer for R thumb DIP pain; to be worn at all times when OOB Restrictions Weight Bearing Restrictions: No   Pain: Pain Assessment Pain Assessment: No/denies pain (resisted R thumb adduction painful with MMT)    See FIM for current functional status  Therapy/Group: Individual Therapy  Mea Ozga 06/02/2012, 8:42 AM

## 2012-06-02 NOTE — Progress Notes (Signed)
Patient ID: Dustin West, male   DOB: March 25, 1946, 67 y.o.   MRN: [id number] Subjective/Complaints: Thumb pain but not wearing splint  A 12 point review of systems has been performed and if not noted above is otherwise negative.  Objective: Vital Signs: Blood pressure 90/67, pulse 86, temperature 98.4 F (36.9 C), temperature source Oral, resp. rate 18, height 5\' 6"  (1.676 m), weight 59.575 kg (131 lb 5.4 oz), SpO2 94.00%. No results found. No results found for this basename: WBC:2,HGB:2,HCT:2,PLT:2 in the last 72 hours No results found for this basename: NA:2,K:2,CL:2,CO:2,GLUCOSE:2,BUN:2,CREATININE:2,CALCIUM:2 in the last 72 hours CBG (last 3)  No results found for this basename: GLUCAP:3 in the last 72 hours  Wt Readings from Last 3 Encounters:  05/28/12 59.575 kg (131 lb 5.4 oz)  05/23/12 70.761 kg (156 lb)    Physical Exam:  Constitutional: She is oriented to person, place, and time. She appears well-developed and well-nourished.  HENT:  Head: Normocephalic and atraumatic.  Right Ear: External ear normal.  Left Ear: External ear normal.  Nose: Nose normal.  Mouth/Throat: Oropharynx is clear and moist.  Eyes: Conjunctivae normal and EOM are normal. Pupils are equal, round, and reactive to light.  Neck: Normal range of motion. Neck supple. No JVD present. No tracheal deviation present. No thyromegaly present.  Cardiovascular: Normal rate, regular rhythm and normal heart sounds. Exam reveals no gallop and no friction rub.  No murmur heard.  Respiratory: Breath sounds normal. She is in respiratory distress. She has no wheezes. She has no rales. She exhibits no tenderness.  GI: Soft. Bowel sounds are normal. She exhibits no distension. There is no tenderness.  Musculoskeletal:R thumb with PIP tenderness and reduced ROM,no directional pain, no erythema, no numbness, mild 1st MCP tenderness Lymphadenopathy:  She has no cervical adenopathy.  Neurological: She is alert and  oriented to person, place, and time.  Low volume, somewhat dysarthric speech. Reasonable insight and awaeness.   Mild left facial weakness. 1/2 sensation left face, arm, leg. Left deltoid, bicep, tricep--1 to 1+, absent wrist and hand, HF 2+, KE and KF - 3, absent ADF and APF. DTR's 2+ right, trace on left. cognitvely he's intact.  Skin: Skin is warm and dry.  Psychiatric: She has a normal mood and affect. behavior is normal. Judgment and thought content normal.    Assessment/Plan: 1. Functional deficits secondary to thrombotic right pontine infarct which require 3+ hours per day of interdisciplinary therapy in a comprehensive inpatient rehab setting. Physiatrist is providing close team supervision and 24 hour management of active medical problems listed below. Physiatrist and rehab team continue to assess barriers to discharge/monitor patient progress toward functional and medical goals. FIM: FIM - Bathing Bathing Steps Patient Completed: Chest;Right Arm;Abdomen;Front perineal area;Buttocks;Right upper leg;Left upper leg;Right lower leg (including foot) Bathing: 4: Min-Patient completes 8-9 66f 10 parts or 75+ percent  FIM - Upper Body Dressing/Undressing Upper body dressing/undressing steps patient completed: Thread/unthread right sleeve of pullover shirt/dresss;Thread/unthread left sleeve of pullover shirt/dress;Put head through opening of pull over shirt/dress;Pull shirt over trunk Upper body dressing/undressing: 5: Set-up assist to: Obtain clothing/put away FIM - Lower Body Dressing/Undressing Lower body dressing/undressing steps patient completed: Thread/unthread right underwear leg;Thread/unthread left underwear leg;Pull underwear up/down;Thread/unthread right pants leg;Thread/unthread left pants leg;Pull pants up/down Lower body dressing/undressing: 4: Steadying Assist  FIM - Toileting Toileting steps completed by patient: Adjust clothing prior to toileting;Performs perineal  hygiene;Adjust clothing after toileting Toileting Assistive Devices: Grab bar or rail for support Toileting: 0: Activity did not  occur  FIM - Diplomatic Services operational officer Devices: Grab bars Toilet Transfers: 6-Assistive device: No helper  FIM - Banker Devices: Therapist, occupational: 5: Bed > Chair or W/C: Supervision (verbal cues/safety issues);5: Chair or W/C > Bed: Supervision (verbal cues/safety issues)  FIM - Locomotion: Wheelchair Distance: 100 Locomotion: Wheelchair: 2: Travels 50 - 149 ft with supervision, cueing or coaxing FIM - Locomotion: Ambulation Locomotion: Ambulation Assistive Devices: Walker - Rolling;Other (comment) (w/ L hand orthosis) Ambulation/Gait Assistance: 4: Min assist Locomotion: Ambulation: 4: Travels 150 ft or more with minimal assistance (Pt.>75%)  Comprehension Comprehension Mode: Auditory Comprehension: 6-Follows complex conversation/direction: With extra time/assistive device  Expression Expression Mode: Verbal Expression Assistive Devices: 6-Communication board Expression: 4-Expresses basic 75 - 89% of the time/requires cueing 10 - 24% of the time. Needs helper to occlude trach/needs to repeat words.  Social Interaction Social Interaction Mode: Asleep Social Interaction: 6-Interacts appropriately with others with medication or extra time (anti-anxiety, antidepressant).  Problem Solving Problem Solving Mode: Not assessed Problem Solving: 5-Solves complex 90% of the time/cues < 10% of the time  Memory Memory Mode: Not assessed Memory: 6-More than reasonable amt of time  Medical Problem List and Plan:  1. DVT Prophylaxis/Anticoagulation: Pharmaceutical: Lovenox  2. Pain Management: Xray negative, pain is likely soft tissue, possibly volar plate 3. Mood: team providing positive reinforcement and encouragement 4. HTN: Recent diagnosis, BPs low d/c cozaar. Monitoring with bid checks.    Renal status nl.  - Education regarding BP control in the context of stroke secondary prevention.  5. Dyslipidemia: continue zocor for management 6.  R thumb pain, probable tendinitis, trial voltaren gel.  Will need to keep thumb spica on for 7 days LOS (Days) 10 A FACE TO FACE EVALUATION WAS PERFORMED  Erick Colace 06/02/2012 8:33 AM

## 2012-06-02 NOTE — Progress Notes (Signed)
Occupational Therapy Session Note  Patient Details  Name: YERAY TOMAS MRN: [id number] Date of Birth: February 22, 1946  Today's Date: 06/02/2012 Time: 1191-4782 Time Calculation (min): 55 min  Short Term Goals: Week 1:  OT Short Term Goal 1 (Week 1): Pt. will bathe UB with minimal assist ( ) OT Short Term Goal 2 (Week 1): Pt. will bathe LB with minimal assist OT Short Term Goal 3 (Week 1): Pt.  will go from sit to stand with supervison  Skilled Therapeutic Interventions/Progress Updates:    Pt seen for 1:1 OT with focus on dynamic standing balance and functional reaching with RUE to challenge balance reactions in standing while weight bearing through LUE. Weight bearing in sitting through LUE and shoulder circles 2 reps of 10.  Towel glides on table to increase shoulder flexion/extension and horizontal abduction/adduction with gravity reduced, however continued to require support at elbow.  Weight bearing to increase attention to LUE and normalize movement patterns while completing task in standing.    Therapy Documentation Precautions:  Precautions Precautions: Fall Precaution Comments: left knee buckles  Required Braces or Orthoses: Other Brace/Splint Other Brace/Splint: R hand thumb immobilizer started today for R thumb DIP pain Restrictions Weight Bearing Restrictions: No Pain: Pain Assessment Pain Assessment: No/denies pain  See FIM for current functional status  Therapy/Group: Individual Therapy  Leonette Monarch 06/02/2012, 2:53 PM

## 2012-06-02 NOTE — Progress Notes (Signed)
Occupational Therapy Session Note  Patient Details  Name: Dustin West MRN: [id number] Date of Birth: Mar 01, 1946  Today's Date: 06/02/2012 Time: 1330-1400 Time Calculation (min): 30 min  Short Term Goals: Week 1:  OT Short Term Goal 1 (Week 1): Pt. will bathe UB with minimal assist ( ) OT Short Term Goal 2 (Week 1): Pt. will bathe LB with minimal assist OT Short Term Goal 3 (Week 1): Pt.  will go from sit to stand with supervison  Skilled Therapeutic Interventions/Progress Updates:  Upon arrival, patient resting in w/c with right thumb brace on.  Engaged in sit><stands, squats with weight shifts onto LLE, and ambulate short distance using LUE in weight bearing position to assist with these tasks.  Also performed LUE P/AA ROM as well as place and hold with focus on normal movement patterns as patient tends to perform left shoulder movements that are initiated by a shoulder hike.  Patient assisted onto left side in bed to allow weight bearing through hip and scapula during rest in bed this afternoon.  Therapy Documentation Precautions:  Precautions Precautions: Fall Precaution Comments: left knee buckles  Required Braces or Orthoses: Other Brace/Splint Other Brace/Splint: R hand thumb immobilizer started today for R thumb DIP pain Restrictions Weight Bearing Restrictions: No Pain: Pain Assessment Pain Assessment: No/denies pain  Therapy/Group: Individual Therapy  Malika Demario 06/02/2012, 4:18 PM

## 2012-06-02 NOTE — Progress Notes (Signed)
Speech Language Pathology Daily Session Note  Patient Details  Name: Dustin West MRN: [id number] Date of Birth: 1945/11/05  Today's Date: 06/02/2012 Time: 6295-2841 Time Calculation (min): 40 min  Short Term Goals: Week 2: SLP Short Term Goal 1 (Week 2): Patient will utilize speech intelligibility strategies during conversation at the sentence-conversational level with supervision level verbal/non-verbal cues SLP Short Term Goal 2 (Week 2): Patient will self monitor and correct errors as well as commuication environment with supervision level verbal cues.  Skilled Therapeutic Interventions: Skilled treatment session focused on addressing speech intelligibility. SLP facilitated session with phone call, door open and TV on to demonstrate environment's impact and SLP provided mod assist verbal and visual cues to assist with patient self-monitoring rate and vocal intensity during conversation. Patient was unable to locate external aids and therefore unable to utilize external aid to recall and/or verbalize strategies.  Patient required min assist verbal and non-verbal cues throughout session.     FIM:  Comprehension Comprehension Mode: Auditory Comprehension: 6-Follows complex conversation/direction: With extra time/assistive device Expression Expression Mode: Verbal Expression: 4-Expresses basic 75 - 89% of the time/requires cueing 10 - 24% of the time. Needs helper to occlude trach/needs to repeat words. Social Interaction Social Interaction: 6-Interacts appropriately with others with medication or extra time (anti-anxiety, antidepressant). Problem Solving Problem Solving: 5-Solves basic problems: With no assist Memory Memory: 5-Requires cues to use assistive device  Pain Pain Assessment Pain Assessment: No/denies pain  Therapy/Group: Individual Therapy  Dustin Ferretti., CCC-SLP [phone]  Dustin West 06/02/2012, 5:04 PM

## 2012-06-03 ENCOUNTER — Inpatient Hospital Stay (HOSPITAL_COMMUNITY): Payer: Medicare Other

## 2012-06-03 ENCOUNTER — Inpatient Hospital Stay (HOSPITAL_COMMUNITY): Payer: Medicare Other | Admitting: Speech Pathology

## 2012-06-03 ENCOUNTER — Inpatient Hospital Stay (HOSPITAL_COMMUNITY): Payer: Medicare Other | Admitting: *Deleted

## 2012-06-03 ENCOUNTER — Inpatient Hospital Stay (HOSPITAL_COMMUNITY): Payer: Medicare Other | Admitting: Occupational Therapy

## 2012-06-03 NOTE — Progress Notes (Signed)
Physical Therapy Progress Note  Patient Details  Name: Dustin West MRN: [id number] Date of Birth: 1945/09/06  Today's Date: 06/03/2012 Time: 0900-1015 Time Calculation (min): 75 min  Patient has met 3 of 4 short term goals.  Long term goals downgraded as patient will likely require supervision with ambulation and dynamic balance activities at discharge and will not reach modified independence.   PT Short Term Goals Week 1:  PT Short Term Goal 1 (Week 1): Patient will be able to perform bed mobility with S/Mod-I PT Short Term Goal 1 - Progress (Week 1): Met PT Short Term Goal 2 (Week 1): Patient will be able to perform transfers with S/Mod-I PT Short Term Goal 2 - Progress (Week 1): Met PT Short Term Goal 3 (Week 1): Patient will ambulate using LRAD x 150' in controlled environmnet with S/Mod-I PT Short Term Goal 3 - Progress (Week 1): Partly met PT Short Term Goal 4 (Week 1): Patient will ascend/descend 4 steps with hand rail with Mod-A  PT Short Term Goal 4 - Progress (Week 1): Met Week 2:  PT Short Term Goal 1 (Week 2): STG's = LTG's secondary to LOS  Skilled Therapeutic Interventions/Progress Updates:  Co-treatment with recreational therapy focused on balance activities. Patient pitched horseshoes while standing on with left foot on balance board, 8 inch step, in tall kneeling and with both feet on foam mat. Patient required most assistance with left foot on balance beam. Tried toe up brace with ambulation. Patient ambulated with rolling walker 150 feet x 2, 180 feet x 1. Patient ambulated 120 feet on carpeted surface with rolling walker. Patient ambulated 240 feet pushing shopping cart. All ambulation with supervision to occasional min contact assist. Patient much better with toe off brace however still occasionally will have decreased clearance of left toe during swing causing loss of balance. Patient continues to have genu recurvatum about 50% of the time even with the brace.    Therapy Documentation Precautions:  Precautions Precautions: Fall Precaution Comments: left knee buckles  Required Braces or Orthoses: Other Brace/Splint Other Brace/Splint: R hand thumb immobilizer for R thumb DIP pain; L toe-off brace being trialed Restrictions Weight Bearing Restrictions: No Pain: Pain Assessment Pain Assessment: No/denies pain Pain Score: 0-No pain Locomotion : Ambulation Ambulation: Yes Ambulation/Gait Assistance: 4: Min guard Ambulation Distance (Feet): 175 Feet Assistive device: Rolling walker (with L hand orthosis) Ambulation/Gait Assistance Details: Verbal cues for gait pattern;Verbal cues for precautions/safety Ambulation/Gait Assistance Details: Patient wearing L toe off orthosis. Patient with improved L foot clearance and placement, but noted intermittent L genu recurvatum. Gait Gait: Yes Gait Pattern: Step-through pattern;Narrow base of support;Decreased stride length;Left genu recurvatum Stairs / Additional Locomotion Stairs: Yes Stairs Assistance: 4: Min assist Stairs Assistance Details: Verbal cues for precautions/safety;Verbal cues for technique;Verbal cues for sequencing Stairs Assistance Details (indicate cue type and reason): Patient ascends leading with R LE, descends leading with L LE. Stair Management Technique: Other (comment);Step to pattern;Forwards (Uses wall on R as per home situation) Number of Stairs: 15  Height of Stairs: 6  Wheelchair Mobility Wheelchair Mobility: No Distance: 0   See FIM for current functional status  Therapy/Group: Co-Treatment with recreational therapy  [name] 06/03/2012, 4:38 PM

## 2012-06-03 NOTE — Progress Notes (Signed)
Patient ID: LEEANDRE NORDLING, male   DOB: 08-Oct-1945, 67 y.o.   MRN: [id number] Subjective/Complaints: Thumb pain but not wearing splint OT notes improvements with ADLs A 12 point review of systems has been performed and if not noted above is otherwise negative.  Objective: Vital Signs: Blood pressure 117/74, pulse 71, temperature 98.1 F (36.7 C), temperature source Oral, resp. rate 19, height 5\' 6"  (1.676 m), weight 59.575 kg (131 lb 5.4 oz), SpO2 93.00%. No results found. No results found for this basename: WBC:2,HGB:2,HCT:2,PLT:2 in the last 72 hours No results found for this basename: NA:2,K:2,CL:2,CO:2,GLUCOSE:2,BUN:2,CREATININE:2,CALCIUM:2 in the last 72 hours CBG (last 3)  No results found for this basename: GLUCAP:3 in the last 72 hours  Wt Readings from Last 3 Encounters:  05/28/12 59.575 kg (131 lb 5.4 oz)  05/23/12 70.761 kg (156 lb)    Physical Exam:  Constitutional: She is oriented to person, place, and time. She appears well-developed and well-nourished.  HENT:  Head: Normocephalic and atraumatic.  Right Ear: External ear normal.  Left Ear: External ear normal.  Nose: Nose normal.  Mouth/Throat: Oropharynx is clear and moist.  Eyes: Conjunctivae normal and EOM are normal. Pupils are equal, round, and reactive to light.  Neck: Normal range of motion. Neck supple. No JVD present. No tracheal deviation present. No thyromegaly present.  Cardiovascular: Normal rate, regular rhythm and normal heart sounds. Exam reveals no gallop and no friction rub.  No murmur heard.  Respiratory: Breath sounds normal. She is in respiratory distress. She has no wheezes. She has no rales. She exhibits no tenderness.  GI: Soft. Bowel sounds are normal. She exhibits no distension. There is no tenderness.  Musculoskeletal:R thumb with PIP tenderness and reduced ROM,no directional pain, no erythema, no numbness, mild 1st MCP tenderness Lymphadenopathy:  She has no cervical adenopathy.    Neurological: She is alert and oriented to person, place, and time.  Low volume, somewhat dysarthric speech. Reasonable insight and awaeness.   Mild left facial weakness. 1/2 sensation left face, arm, leg. Left deltoid, bicep, tricep--1 to 1+, absent wrist and hand, HF 2+, KE and KF - 3, absent ADF and APF. DTR's 2+ right, trace on left. cognitvely he's intact.  Skin: Skin is warm and dry.  Psychiatric: She has a normal mood and affect. behavior is normal. Judgment and thought content normal.    Assessment/Plan: 1. Functional deficits secondary to thrombotic right pontine infarct which require 3+ hours per day of interdisciplinary therapy in a comprehensive inpatient rehab setting. Physiatrist is providing close team supervision and 24 hour management of active medical problems listed below. Physiatrist and rehab team continue to assess barriers to discharge/monitor patient progress toward functional and medical goals. FIM: FIM - Bathing Bathing Steps Patient Completed: Chest;Right Arm;Abdomen;Front perineal area;Buttocks;Right upper leg;Left upper leg;Right lower leg (including foot) Bathing: 4: Min-Patient completes 8-9 7f 10 parts or 75+ percent  FIM - Upper Body Dressing/Undressing Upper body dressing/undressing steps patient completed: Thread/unthread right sleeve of pullover shirt/dresss;Thread/unthread left sleeve of pullover shirt/dress;Put head through opening of pull over shirt/dress;Pull shirt over trunk Upper body dressing/undressing: 5: Set-up assist to: Obtain clothing/put away FIM - Lower Body Dressing/Undressing Lower body dressing/undressing steps patient completed: Thread/unthread right underwear leg;Thread/unthread left underwear leg;Pull underwear up/down;Thread/unthread right pants leg;Thread/unthread left pants leg;Pull pants up/down Lower body dressing/undressing: 4: Steadying Assist  FIM - Toileting Toileting steps completed by patient: Adjust clothing prior to  toileting;Performs perineal hygiene;Adjust clothing after toileting Toileting Assistive Devices: Grab bar or rail for support  Toileting: 0: Activity did not occur  FIM - Diplomatic Services operational officer Devices: Grab bars Toilet Transfers: 6-Assistive device: No helper  FIM - Banker Devices: Therapist, occupational: 5: Bed > Chair or W/C: Supervision (verbal cues/safety issues);5: Chair or W/C > Bed: Supervision (verbal cues/safety issues)  FIM - Locomotion: Wheelchair Distance: 100 Locomotion: Wheelchair: 1: Total Assistance/staff pushes wheelchair (Pt<25%) (pt with difficulty propelling while wearing R wrist splint) FIM - Locomotion: Ambulation Locomotion: Ambulation Assistive Devices: Designer, industrial/product Ambulation/Gait Assistance: 4: Min assist Locomotion: Ambulation: 2: Travels 50 - 149 ft with supervision/safety issues  Comprehension Comprehension Mode: Auditory Comprehension: 6-Follows complex conversation/direction: With extra time/assistive device  Expression Expression Mode: Verbal Expression Assistive Devices: 6-Communication board Expression: 4-Expresses basic 75 - 89% of the time/requires cueing 10 - 24% of the time. Needs helper to occlude trach/needs to repeat words.  Social Interaction Social Interaction Mode: Asleep Social Interaction: 6-Interacts appropriately with others with medication or extra time (anti-anxiety, antidepressant).  Problem Solving Problem Solving Mode: Not assessed Problem Solving: 5-Solves basic problems: With no assist  Memory Memory Mode: Not assessed Memory: 5-Requires cues to use assistive device  Medical Problem List and Plan:  1. DVT Prophylaxis/Anticoagulation: Pharmaceutical: Lovenox  2. Pain Management: Xray negative, pain is likely soft tissue, possibly volar plate 3. Mood: team providing positive reinforcement and encouragement 4. HTN: Recent diagnosis, BPs low d/c cozaar.  Monitoring with bid checks.   Renal status nl.  - Education regarding BP control in the context of stroke secondary prevention.  5. Dyslipidemia: continue zocor for management 6.  R thumb pain, probable tendinitis, trial voltaren gel.  Will need to keep thumb spica on for 7 days LOS (Days) 11 A FACE TO FACE EVALUATION WAS PERFORMED  Erick Colace 06/03/2012 8:52 AM

## 2012-06-03 NOTE — Progress Notes (Signed)
Occupational Therapy Session Note  Patient Details  Name: Dustin West MRN: [id number] Date of Birth: 1945-11-15  Today's Date: 06/03/2012 Time: 0815-0900 Time Calculation (min): 45 min  Short Term Goals: Week 1:  OT Short Term Goal 1 (Week 1): Pt. will bathe UB with minimal assist ( ) OT Short Term Goal 2 (Week 1): Pt. will bathe LB with minimal assist OT Short Term Goal 3 (Week 1): Pt.  will go from sit to stand with supervison  Skilled Therapeutic Interventions/Progress Updates:    Pt seen for ADL retraining with focus on functional transfers, safety with mobility, and education on dressing techniques.  Pt on toilet upon arrival, completed toileting hygiene and clothing management with distant supervision.  Engaged in side stepping with RW to obtain clothing prior to shower, pt required min assist with sidestepping secondary to quick pace/impulsivity.  Pt completed bathing at sit <> stand in walk-in shower with use of shower chair for increased safety and independence.  Dressing completed with supervision, except assist to don Lt shoe with pt with decreased willingness to attempt alternative methods (shoe button or elastic shoe laces).  Therapy Documentation Precautions:  Precautions Precautions: Fall Precaution Comments: left knee buckles  Required Braces or Orthoses: Other Brace/Splint Other Brace/Splint: R hand thumb immobilizer started today for R thumb DIP pain Restrictions Weight Bearing Restrictions: No Pain:  Pt with no c/o pain this session.  See FIM for current functional status  Therapy/Group: Individual Therapy  Leonette Monarch 06/03/2012, 9:03 AM

## 2012-06-03 NOTE — Progress Notes (Signed)
Recreational Therapy Session Note  Patient Details  Name: Dustin West MRN: [id number] Date of Birth: [birth year] Today's Date: 06/03/2012 Time:  10:03-157 Pain: no c/o Skilled Therapeutic Interventions/Progress Updates: Pt ambulated with RW from room to gym with supervision and cues for safety.  Session focused on horseshoe activity reaching outside BOS with RUE while in tall kneeling, standing with RLE on ~1.5 inch foam balance beam & then up on a step stool working on activity tolerance, L hip/knee control, dynamic balance, safety awareness with min -mod assist and mod instructional cues for correction/safety.  Co-treatment Carrin Vannostrand 06/03/2012, 12:20 PM

## 2012-06-03 NOTE — Progress Notes (Signed)
Physical Therapy Session Note  Patient Details  Name: Dustin West MRN: [id number] Date of Birth: March 25, 1946  Today's Date: 06/03/2012 Time: 1130-1200 Time Calculation (min): 30 min  Short Term Goals: Week 1:  PT Short Term Goal 1 (Week 1): Patient will be able to perform bed mobility with S/Mod-I PT Short Term Goal 1 - Progress (Week 1): Met PT Short Term Goal 2 (Week 1): Patient will be able to perform transfers with S/Mod-I PT Short Term Goal 2 - Progress (Week 1): Met PT Short Term Goal 3 (Week 1): Patient will ambulate using LRAD x 150' in controlled environmnet with S/Mod-I PT Short Term Goal 3 - Progress (Week 1): Partly met PT Short Term Goal 4 (Week 1): Patient will ascend/descend 4 steps with hand rail with Mod-A  PT Short Term Goal 4 - Progress (Week 1): Met  Skilled Therapeutic Interventions/Progress Updates:    Patient received sitting in recliner. Today's session focused on increasing endurance through therapeutic activity and stair negotiation. Patient instructed in gait training 175' x2 with rolling walker with L hand orthosis, see details below. Trial with L toe off brace. Noted improved L foot clearance and placement, however patient presents with intermittent L genu recurvatum. Patient performed car transfer with rolling walker and supervision at two different heights (low seated sedan level and midsize SUV level) secondary to patient unsure what type of car he will go home in. Patient negotiated 15 stairs with use of wall on R instead of handrail secondary to patient's home situation.  Patient returned to room and left seated in recliner with all needs within reach.  Therapy Documentation Precautions:  Precautions Precautions: Fall Precaution Comments: left knee buckles  Required Braces or Orthoses: Other Brace/Splint Other Brace/Splint: R hand thumb immobilizer for R thumb DIP pain; L toe-off brace being trialed Restrictions Weight Bearing Restrictions:  No Pain: Pain Assessment Pain Assessment: No/denies pain Pain Score: 0-No pain Locomotion : Ambulation Ambulation: Yes Ambulation/Gait Assistance: 5: Supervision Ambulation Distance (Feet): 175 Feet Assistive device: Rolling walker (with L hand orthosis) Ambulation/Gait Assistance Details: Verbal cues for gait pattern;Verbal cues for precautions/safety Ambulation/Gait Assistance Details: Patient wearing L toe off orthosis. Patient with improved L foot clearance and placement, but noted intermittent L genu recurvatum. Gait Gait: Yes Gait Pattern: Step-through pattern;Narrow base of support;Decreased stride length;Left genu recurvatum Stairs / Additional Locomotion Stairs: Yes Stairs Assistance: 4: Min assist Stairs Assistance Details: Verbal cues for precautions/safety;Verbal cues for technique;Verbal cues for sequencing Stairs Assistance Details (indicate cue type and reason): Patient ascends leading with R LE, descends leading with L LE. Stair Management Technique: Other (comment);Step to pattern;Forwards (Uses wall on R as per home situation) Number of Stairs: 15  Height of Stairs: 6  Wheelchair Mobility Wheelchair Mobility: No Distance: 0    See FIM for current functional status  Therapy/Group: Individual Therapy  Chipper Herb. Livi Mcgann, PT, DPT  06/03/2012, 2:16 PM

## 2012-06-03 NOTE — Progress Notes (Signed)
Speech Language Pathology Daily Session Note  Patient Details  Name: Dustin West MRN: [id number] Date of Birth: [birth year]  Today's Date: 06/03/2012 Time: 4098-1191 Time Calculation (min): 45 min  Short Term Goals: Week 2: SLP Short Term Goal 1 (Week 2): Patient will utilize speech intelligibility strategies during conversation at the sentence-conversational level with supervision level verbal/non-verbal cues SLP Short Term Goal 2 (Week 2): Patient will self monitor and correct errors as well as commuication environment with supervision level verbal cues.  Skilled Therapeutic Interventions: Skilled treatment session focused on addressing speech intelligibility at the conversational level.  SLP facilitated session with supervision level verbal cues to self-monitor and correct rate and intensity of speech in various environments.  Patient reporting his speech is fine; however, phone call with daughter revealed that his speech is different from baseline with her requesting repeats while on phone.  Patient then more agreeable to therapy and was Mod I for rest of session with slower more over articulated speech.     FIM:  Comprehension Comprehension Mode: Auditory Comprehension: 6-Follows complex conversation/direction: With extra time/assistive device Expression Expression Mode: Verbal Expression: 5-Expresses basic 90% of the time/requires cueing < 10% of the time. Social Interaction Social Interaction: 6-Interacts appropriately with others with medication or extra time (anti-anxiety, antidepressant). Problem Solving Problem Solving: 5-Solves basic problems: With no assist Memory Memory: 5-Requires cues to use assistive device  Pain Pain Assessment Pain Assessment: No/denies pain Pain Score: 0-No pain  Therapy/Group: Individual Therapy  Charlane Ferretti., CCC-SLP [phone]  Ariyona Eid 06/03/2012, 4:27 PM

## 2012-06-04 ENCOUNTER — Inpatient Hospital Stay (HOSPITAL_COMMUNITY): Payer: Medicare Other | Admitting: Occupational Therapy

## 2012-06-04 ENCOUNTER — Inpatient Hospital Stay (HOSPITAL_COMMUNITY): Payer: Medicare Other | Admitting: Physical Therapy

## 2012-06-04 ENCOUNTER — Inpatient Hospital Stay (HOSPITAL_COMMUNITY): Payer: Medicare Other | Admitting: *Deleted

## 2012-06-04 NOTE — Progress Notes (Addendum)
Patient ID: Dustin West, male   DOB: 1946-02-18, 67 y.o.   MRN: [id number] Subjective/Complaints: L shoulder pain after the pt lay on shoulder all nite. A 12 point review of systems has been performed and if not noted above is otherwise negative.  Objective: Vital Signs: Blood pressure 119/70, pulse 76, temperature 98.2 F (36.8 C), temperature source Oral, resp. rate 18, height 5\' 6"  (1.676 m), weight 59.575 kg (131 lb 5.4 oz), SpO2 98.00%. No results found. No results found for this basename: WBC:2,HGB:2,HCT:2,PLT:2 in the last 72 hours No results found for this basename: NA:2,K:2,CL:2,CO:2,GLUCOSE:2,BUN:2,CREATININE:2,CALCIUM:2 in the last 72 hours CBG (last 3)  No results found for this basename: GLUCAP:3 in the last 72 hours  Wt Readings from Last 3 Encounters:  05/28/12 59.575 kg (131 lb 5.4 oz)  05/23/12 70.761 kg (156 lb)    Physical Exam:  Constitutional: She is oriented to person, place, and time. She appears well-developed and well-nourished.  HENT:  Head: Normocephalic and atraumatic.  Right Ear: External ear normal.  Left Ear: External ear normal.  Nose: Nose normal.  Mouth/Throat: Oropharynx is clear and moist.  Eyes: Conjunctivae normal and EOM are normal. Pupils are equal, round, and reactive to light.  Neck: Normal range of motion. Neck supple. No JVD present. No tracheal deviation present. No thyromegaly present.  Cardiovascular: Normal rate, regular rhythm and normal heart sounds. Exam reveals no gallop and no friction rub.  No murmur heard.  Respiratory: Breath sounds normal. GI: Soft. Bowel sounds are normal. She exhibits no distension. There is no tenderness.  Musculoskeletal:R thumb with PIP tenderness and reduced ROM,no directional pain, no erythema, no numbness, mild 1st MCP tenderness Lymphadenopathy:  She has no cervical adenopathy.  Neurological: She is alert and oriented to person, place, and time.  Low volume, somewhat dysarthric speech.  Reasonable insight and awaeness.   Mild left facial weakness. 1/2 sensation left face, arm, leg. Left deltoid, bicep, tricep--1 to 1+, absent wrist and hand, HF 2+, KE and KF - 3, absent ADF and APF. DTR's 2+ right, trace on left. cognitvely he's intact.  Skin: Skin is warm and dry.  Psychiatric: She has a normal mood and affect. behavior is normal. Judgment and thought content normal.    Assessment/Plan: 1. 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 Physiatrist is providing close team supervision and 24 hour management of active medical problems listed below. Physiatrist and rehab team continue to assess barriers to discharge/monitor patient progress toward functional and medical goals. FIM: FIM - Bathing Bathing Steps Patient Completed: Chest;Right Arm;Abdomen;Front perineal area;Buttocks;Right upper leg;Left upper leg;Right lower leg (including foot);Left lower leg (including foot) Bathing: 4: Min-Patient completes 8-9 53f 10 parts or 75+ percent  FIM - Upper Body Dressing/Undressing Upper body dressing/undressing steps patient completed: Thread/unthread right sleeve of pullover shirt/dresss;Thread/unthread left sleeve of pullover shirt/dress;Put head through opening of pull over shirt/dress;Pull shirt over trunk Upper body dressing/undressing: 5: Supervision: Safety issues/verbal cues FIM - Lower Body Dressing/Undressing Lower body dressing/undressing steps patient completed: Thread/unthread right underwear leg;Thread/unthread left underwear leg;Pull underwear up/down;Thread/unthread right pants leg;Thread/unthread left pants leg;Pull pants up/down;Don/Doff right sock;Don/Doff left sock;Don/Doff right shoe Lower body dressing/undressing: 4: Min-Patient completed 75 plus % of tasks  FIM - Toileting Toileting steps completed by patient: Adjust clothing  prior to toileting;Performs perineal hygiene;Adjust clothing after toileting Toileting Assistive Devices: Grab bar or rail for support Toileting: 5: Supervision:  Safety issues/verbal cues  FIM - Diplomatic Services operational officer Devices: Grab bars Toilet Transfers: 5-From toilet/BSC: Supervision (verbal cues/safety issues);5-To toilet/BSC: Supervision (verbal cues/safety issues)  FIM - Banker Devices: Walker;Orthosis;Arm rests (L hand orthosis) Bed/Chair Transfer: 6: Supine > Sit: No assist;6: Sit > Supine: No assist;5: Bed > Chair or W/C: Supervision (verbal cues/safety issues);5: Chair or W/C > Bed: Supervision (verbal cues/safety issues)  FIM - Locomotion: Wheelchair Distance: 0 Locomotion: Wheelchair: 0: Activity did not occur FIM - Locomotion: Ambulation Locomotion: Ambulation Assistive Devices: Designer, industrial/product Ambulation/Gait Assistance: 4: Min guard Locomotion: Ambulation: 4: Travels 150 ft or more with minimal assistance (Pt.>75%)  Comprehension Comprehension Mode: Auditory Comprehension: 6-Follows complex conversation/direction: With extra time/assistive device  Expression Expression Mode: Verbal Expression Assistive Devices: 6-Communication board Expression: 5-Expresses basic needs/ideas: With extra time/assistive device  Social Interaction Social Interaction Mode: Asleep Social Interaction: 6-Interacts appropriately with others with medication or extra time (anti-anxiety, antidepressant).  Problem Solving Problem Solving Mode: Not assessed Problem Solving: 5-Solves basic problems: With no assist  Memory Memory Mode: Not assessed Memory: 5-Requires cues to use assistive device  Medical Problem List and Plan:  1. DVT Prophylaxis/Anticoagulation: Pharmaceutical: Lovenox  2. Pain Management: Xray negative, pain is likely soft tissue, possibly volar plate 3. Mood: team providing positive reinforcement and  encouragement 4. HTN: Recent diagnosis, BPs low d/c cozaar. Monitoring with bid checks.   Renal status nl.  - Education regarding BP control in the context of stroke secondary prevention.  5. Dyslipidemia: continue zocor for management 6.  R thumb pain, probable tendinitis, trial voltaren gel.  Will need to keep thumb spica on for 7 days LOS (Days) 12 A FACE TO FACE EVALUATION WAS PERFORMED  Erick Colace 06/04/2012 9:28 AM

## 2012-06-04 NOTE — Patient Care Conference (Signed)
Inpatient RehabilitationTeam Conference and Plan of Care Update Date: 06/04/2012   Time: 10:55 AM    Patient Name: Dustin West      Medical Record Number: [id number]  Date of Birth: 10-Oct-1945 Sex: Male         Room/Bed: 4149/4149-01 Payor Info: Payor: MEDICARE  Plan: MEDICARE PART A AND B  Product Type: *No Product type*     Admitting Diagnosis: R CVA  Admit Date/Time:  05/23/2012  1:12 PM Admission Comments: No comment available   Primary Diagnosis:  CVA (cerebral infarction) Principal Problem: CVA (cerebral infarction)  Patient Active Problem List   Diagnosis Date Noted  . CVA (cerebral infarction) 05/26/2012  . HTN (hypertension) 05/26/2012    Expected Discharge Date: Expected Discharge Date: 06/06/12  Team Members Present: Physician leading conference: Dr. Claudette Laws Social Worker Present: Dossie Der, LCSW Nurse Present: Other (comment) Milly Jakob Rcom-RN) PT Present: Edman Circle, PT;Becky Henrene Dodge, PT;Other (comment) Clarisse Gouge Ripa-PT) OT Present: Bretta Bang, Verlene Mayer, OT SLP Present: Fae Pippin, SLP Other (Discipline and Name): Vernona Rieger Jobe-Dietician     Current Status/Progress Goal Weekly Team Focus  Medical   shoulder pain, plateau at supervision  mob, R thumb pain, cont B and B  pain control, education  D/C planning   Bowel/Bladder   Continent of bowel and bladder LBM 06/03/2012.  Remain continent of bowel and bladder  Monitor.   Swallow/Nutrition/ Hydration             ADL's   Supervision grooming, UB/LB bathing, UB dressing and stand pivot transfers; min assist LB dressing secondary to hesistancy to use AE.  Ambulation and transfers with RW close supervision, occasional min assist with direction change or sidestepping.  Min assist- Supervision  A/ROM L UE, family education, standing balance, functional transfers, ADL performance    Mobility   supervision with transfers; supervision to occasional min assist with ambulation, min assist with  steps  supervision with ambulation and car transfers; mod I basic transfers; min assist steps  patient/family education; meet with prosthetist regarding toe off brace; high level balance, gait and stairs   Communication   supervision-min assist depending on environment  supervison-mod I   family education    Safety/Cognition/ Behavioral Observations            Pain   Denies any pain   n/a  Monitor any onset of pain.   Skin   n/a  n/a  Assess skin q shift.      *See Interdisciplinary Assessment and Plan and progress notes for long and short-term goals  Barriers to Discharge: home accessibility    Possible Resolutions to Barriers:  fam and pt ed    Discharge Planning/Teaching Needs:  Son moving in with pt. daughter or Son should be coming in for family education prior to discharge      Team Discussion:  Meeting goals and will do family education prior to discharge. Ortho splint-eval.  Pt not safe all of the time needs supervision level  Revisions to Treatment Plan:  None   Continued Need for Acute Rehabilitation Level of Care: The patient requires daily medical management by a physician with specialized training in physical medicine and rehabilitation for the following conditions: Daily direction of a multidisciplinary physical rehabilitation program to ensure safe treatment while eliciting the highest outcome that is of practical value to the patient.: Yes Daily medical management of patient stability for increased activity during participation in an intensive rehabilitation regime.: Yes Daily analysis of laboratory values and/or  radiology reports with any subsequent need for medication adjustment of medical intervention for : Neurological problems  Azeneth Carbonell, Lemar Livings 06/04/2012, 2:50 PM

## 2012-06-04 NOTE — Progress Notes (Signed)
Social Work Patient ID: Dustin West, male   DOB: 1945/11/13, 67 y.o.   MRN: [id number] Met with pt and spoke with daughter via telephone to discuss team conference progression toward goals and discharge still Friday. Scheduled family education for 10;-- on Friday prior to pt going home.  Daughter and son are coming.  Will set up DME and follow up therapies. Prepare for discharge Friday.

## 2012-06-04 NOTE — Progress Notes (Signed)
Recreational Therapy Session Note  Patient Details  Name: VERSIE FLEENER MRN: [id number] Date of Birth: 1945-06-02 Today's Date: 06/04/2012 Time:  810-9 Pain: no c/o Skilled Therapeutic Interventions/Progress Updates: Pt participated in balance activity playing Wii Fit- Skiing, Wii bowling, & then a modified batting activity with focus on dynamic standing balance, lateral weight shifts, trunk rotation, L knee control, & LUE use.  Discussion with pt about purpose of community reintegration & potential plans for outing tomorrow, pt agreeable to participate. Therapy/Group: Co-Treatment  Jaliel Deavers 06/04/2012, 9:06 AM

## 2012-06-04 NOTE — Progress Notes (Signed)
Occupational Therapy Session Note  Patient Details  Name: Dustin West MRN: [id number] Date of Birth: 24-Feb-1946  Today's Date: 06/04/2012 Time: 0902-0958 Time Calculation (min): 56 min  Skilled Therapeutic Interventions/Progress Updates:    Worked on bathing and dressing during session.  Performed shower sitting on the shower seat.  Pt able to perform short distance ambulation to the shower with min assist and use of the RW with walker splint on the right.  Pt able to shower with min assist needed to wash the RUE.  Pt needed max hand over hand assist to hold the washcloth in the left hand and bring across his body for washing the right arm.  Needed mod instructional cueing throughout session to place the LUE in his lap instead of letting it hang on his side while sitting.  Performed all dressing following hemidressing techniques.  Needs assistance with donning the left AFO and shoe as well as tying it.  Instructed pt to perform board slides with his LUE while resting between therapy sessions to increase LUE functional movement.  Provided small board and washcloth to use with emphasis on shoulder flexion and elbow extension as well internal and external rotation.  Therapy Documentation Precautions:  Precautions Precautions: Fall Precaution Comments: left knee buckles  Required Braces or Orthoses: Other Brace/Splint Other Brace/Splint: R hand thumb immobilizer for R thumb DIP pain; L toe-off brace being trialed Restrictions Weight Bearing Restrictions: No  Pain: Pain Assessment Pain Assessment: 0-10 Pain Score:   1 Pain Type: Acute pain Pain Location: Hand Pain Orientation: Right Pain Intervention(s): Medication (See eMAR);Splinting ADL: See FIM for current functional status  Therapy/Group: Individual Therapy  Juleon Narang OTR/L 06/04/2012, 10:57 AM

## 2012-06-04 NOTE — Progress Notes (Signed)
Physical Therapy Progress Note  Patient Details  Name: Dustin West MRN: [id number] Date of Birth: 1945/07/26  Today's Date: 06/04/2012 Time: 0805-0900 Time Calculation (min): 55 min  PT Short Term Goals= LTGs due to d/c planned for 06/06/12  Skilled Therapeutic Interventions/Progress Updates:  Standing balance during dressing with supervision.  Co-treatment with Rec Therapy for skilled +2 needed to work on dynamic standing balance  neuromuscular re-education via forced use, tactile and manual cues  For: -balance retraining in standing using Wii fit for lateral wt shifts through pelvis, diagonal wt shifts while releasing ball from R hand, and L trunk rotation while swinging a bat -gait wearing L Allard Toe Off brace, x 150' with min assist fading to supervision on straight path, focusing on upright posture, forward gaze, L knee stance control to prevent hyperextension  Pt required max assist intermittently to prevent fall, due to LOB backwards and to L,  Pt demonstrates delayed and inadequate hip and ankle strategies for balance recovery.     Therapy Documentation Precautions:  Precautions Precautions: Fall Precaution Comments: left knee buckles  Required Braces or Orthoses: Other Brace/Splint Other Brace/Splint: R hand thumb immobilizer for R thumb DIP pain; L toe-off brace being trialed Restrictions Weight Bearing Restrictions: No   Pain: Pain Assessment Pain Assessment: No/denies pain       See FIM for current functional status  Therapy/Group: Individual Therapy and Co-Treatment  Dustin West 06/04/2012, 9:38 AM

## 2012-06-04 NOTE — Progress Notes (Signed)
Physical Therapy Session Note  Patient Details  Name: Dustin West MRN: [id number] Date of Birth: Aug 20, 1945  Today's Date: 06/04/2012 Time: 4098-1191 Time Calculation (min): 43 min  Second Treatment:  13:32-13:59 Time:  27 min  Skilled Therapeutic Interventions/Progress Updates:    First session:  See below for [employer]. Explained results of Berg to pt and pt verbalized understanding of fall risk. Step ups x 15 on step leading with LLE without UE support, with close supervision.  Gait training on unit with RW, 150' and 250" with close supervision.  Car transfer with close supervision.  Second Session:  Gait on unit with RW 150' x 2 with supervision.  Kinetron in standing on 10 cm2 x 5 min x 2 addressing strength and wt bearing through the LLE.  Pt needing facilitation to not rotate L hip posteriorly when extending and weight bearing through the LLE.  Therapy Documentation Precautions:  Precautions Precautions: Fall Precaution Comments: left knee buckles  Required Braces or Orthoses: Other Brace/Splint Other Brace/Splint: R hand thumb immobilizer for R thumb DIP pain; L toe-off brace being trialed Restrictions Weight Bearing Restrictions: No Pain:  No pain Balance: Standardized Balance Assessment Standardized Balance Assessment: Berg Balance Test Berg Balance Test Sit to Stand: Able to stand without using hands and stabilize independently Standing Unsupported: Able to stand safely 2 minutes Sitting with Back Unsupported but Feet Supported on Floor or Stool: Able to sit safely and securely 2 minutes Stand to Sit: Controls descent by using hands Transfers: Able to transfer safely, minor use of hands Standing Unsupported with Eyes Closed: Able to stand 10 seconds with supervision Standing Ubsupported with Feet Together: Able to place feet together independently and stand for 1 minute with supervision From Standing, Reach Forward with Outstretched Arm: Can reach forward >12  cm safely (5") From Standing Position, Pick up Object from Floor: Able to pick up shoe, needs supervision From Standing Position, Turn to Look Behind Over each Shoulder: Looks behind from both sides and weight shifts well Turn 360 Degrees: Needs assistance while turning Standing Unsupported, Alternately Place Feet on Step/Stool: Able to complete >2 steps/needs minimal assist Standing Unsupported, One Foot in Front: Able to take small step independently and hold 30 seconds Standing on One Leg: Able to lift leg independently and hold equal to or more than 3 seconds Total Score: 40  See FIM for current functional status  Therapy/Group: Individual Therapy  Georges Mouse 06/04/2012, 1:42 PM

## 2012-06-05 ENCOUNTER — Encounter (HOSPITAL_COMMUNITY): Payer: Medicare Other | Admitting: Occupational Therapy

## 2012-06-05 ENCOUNTER — Inpatient Hospital Stay (HOSPITAL_COMMUNITY): Payer: Medicare Other | Admitting: *Deleted

## 2012-06-05 ENCOUNTER — Inpatient Hospital Stay (HOSPITAL_COMMUNITY): Payer: Medicare Other

## 2012-06-05 DIAGNOSIS — G8110 Spastic hemiplegia affecting unspecified side: Secondary | ICD-10-CM

## 2012-06-05 DIAGNOSIS — I6330 Cerebral infarction due to thrombosis of unspecified cerebral artery: Secondary | ICD-10-CM

## 2012-06-05 NOTE — Progress Notes (Addendum)
Physical Therapy Session Note  Patient Details  Name: Dustin West MRN: [id number] Date of Birth: February 05, 1946  Today's Date: 06/05/2012 Time: 3244-0102 and 1330-1520 Time Calculation (min): 28 min and 110 min  Short Term Goals: Week 2:  PT Short Term Goal 1 (Week 2): STG's = LTG's secondary to LOS     Skilled Therapeutic Interventions/Progress Updates:  AM- Chris from [facility] here for AFO evaluation.  Gait training without AD, without AFO, x 50' focusing on L stance control.  Pt is able to significantly increase speed while ambulating with AD and LAFO, but L knee hyperextends more often, x 100' with close supervision.  floor transfer with min guard assist.  Up/down flight of 12 steps using wall on R, not rail when ascending, using rail when descending because wall not available., min guard assist, 1 VC for L foot placement.  PM- Community outing to [employer] with Rec therapist, for +2 skilled assistance for community integration  Pt impulsive when leaving his room, during recliner> w/c transfer to L, requiring max assist to prevent fall .  Gait training with RW and with grocery cart, x 200' x 4 during the session, focusing on scanning environment to L with min cues.  Pt wearing new posterior leaf spring L AFO with resulting excellent L knee control, no hyperextension.  Pt is very pleased with AFO.    Gait up/down 3 high steps in and out of van, using pole for support for R hand, min guard assist, without cues for sequencing.    Pt able to state he was tired and sat after gait x 200'.  Discussed options for sitting to rest in a store, if pt does not have his w/c.  Pt had 1 LOB L while ambulating with RW, while raising R hand off RW to get attention of a Geologist, engineering.  He required mod assist to regain balance.  While standing with Rw, pt used R hand to retrieve items off of shelves, and remove and replace phone cards on holder, without LOB.  Pt used public restroom, gait  with RW , opening door and using RW with 1 VC.  He balanced in standing to urinate, managing clothes with distant supervision.    W/c> bed to R with supervision.    Therapy Documentation Precautions:  Precautions Precautions: Fall Precaution Comments: left knee buckles  Required Braces or Orthoses: Other Brace/Splint Other Brace/Splint: R hand thumb immobilizer for R thumb DIP pain; L toe-off brace being trialed Restrictions Weight Bearing Restrictions: No   Pain: Pain Assessment Pain Assessment: No/denies pain       See FIM for current functional status  Therapy/Group: Individual Therapy and Co-Treatment   [name] 06/05/2012, 9:09 AM

## 2012-06-05 NOTE — Progress Notes (Signed)
Occupational Therapy Discharge Summary  Patient Details  Name: Dustin West MRN: [id number] Date of Birth: 1946-02-05  Today's Date: 06/05/2012 Time: 1330-1520 Time Calculation (min): 110 min  Patient has met 7 of 10 long term goals due to improved activity tolerance, improved balance, functional use of  LEFT upper extremity, improved attention and improved awareness.  Patient to discharge at [facility] Assist level.  Patient's care partner is independent to provide the necessary physical assistance at discharge.    Reasons goals not met: Pt continues to need min assist for UB bathing and LB dressing tasks.  Meal prep goal was not addressed.  Recommendation:  Patient will benefit from ongoing skilled OT services in home health setting to continue to advance functional skills in the area of BADL.  Feel pt needs continued HHOT to continue progressing LUE functional use, awareness, balance, as they all relate to his selfcare independence.  At this time recommend 24 hour supervision for safety secondary to pt being at a higher risk to fall.  Equipment: No equipment provided  Reasons for discharge: treatment goals met and discharge from hospital  Patient/family agrees with progress made and goals achieved: Yes  OT Discharge Precautions/Restrictions  Precautions Precautions: Fall Precaution Comments: Left inattention and left hemiparesis Required Braces or Orthoses: Other Brace/Splint Other Brace/Splint: R hand thumb immobilizer for R thumb DIP pain; L AFO Restrictions Weight Bearing Restrictions: No  Vital Signs Therapy Vitals Temp: 98.1 F (36.7 C) Temp src: Oral Pulse Rate: 75  Resp: 18  BP: 147/77 mmHg Patient Position, if appropriate: Lying Oxygen Therapy SpO2: 94 % O2 Device: None (Room air) Pain Pain Assessment Pain Assessment: No/denies pain ADL ADL Eating: Modified independent Where Assessed-Eating: Chair Grooming: Modified independent Where  Assessed-Grooming: Chair Upper Body Bathing: Minimal assistance Where Assessed-Upper Body Bathing: Shower Lower Body Bathing: Minimal assistance Where Assessed-Lower Body Bathing: Shower Upper Body Dressing: Supervision/safety Where Assessed-Upper Body Dressing: Edge of bed Lower Body Dressing: Minimal assistance Where Assessed-Lower Body Dressing: Edge of bed Toileting: Supervision/safety Where Assessed-Toileting: Teacher, adult education: Close supervision Toilet Transfer Method: Stand pivot Tub/Shower Transfer: Close supervison Tub/Shower Transfer Method: Ship broker: Insurance underwriter: Close supervision Film/video editor Method: Designer, industrial/product: Grab bars;Transfer tub bench ADL Comments: Pt needs cueing to position the LUE and LLE prior to tranfers and sit to stand.  Needs assistance with donning the left AFO and shoe as well as tying it. Vision/Perception  Vision - History Baseline Vision: Bifocals Patient Visual Report: No change from baseline Vision - Assessment Eye Alignment: Within Functional Limits Vision Assessment: Vision tested Additional Comments: Pt with occasional visual inattention to the left. Perception Perception: Within Functional Limits Praxis Praxis: Intact  Cognition Overall Cognitive Status: Appears within functional limits for tasks assessed Arousal/Alertness: Awake/alert Orientation Level: Oriented X4 Attention: Sustained Sustained Attention: Appears intact Memory: Impaired Memory Impairment: Other (comment) (Needs cueing for LUE and LLE positioning, unable to remember) Sensation   Motor    Mobility     Trunk/Postural Assessment     Balance Balance Balance Assessed: Yes Dynamic Sitting Balance Dynamic Sitting - Balance Support: No upper extremity supported Dynamic Sitting - Level of Assistance: 5: Stand by assistance Static Standing Balance Static Standing - Balance  Support: Left upper extremity supported Static Standing - Level of Assistance: 5: Stand by assistance Dynamic Standing Balance Dynamic Standing - Balance Support: Right upper extremity supported;Left upper extremity supported Dynamic Standing - Level of Assistance: 5: Stand by assistance Extremity/Trunk  Assessment RUE Assessment RUE Assessment: Within Functional Limits  LUE Assessment: is currently at Brunnstrom stage 3.    See FIM for current functional status  MCGUIRE,JAMES OTR/L 06/05/2012, 4:54 PM

## 2012-06-05 NOTE — Plan of Care (Signed)
Problem: RH Bathing Goal: LTG Patient will bathe with assist, cues/equipment (OT) LTG: Patient will bathe specified number of body parts with assist with/without cues using equipment (position) (OT)  Outcome: Not Met (add Reason) Needs min assist for washing the LUE.  Problem: RH Dressing Goal: LTG Patient will perform lower body dressing w/assist (OT) LTG: Patient will perform lower body dressing with assist, with/without cues in positioning using equipment (OT)  Outcome: Not Met (add Reason) Needs min assist for left AFO and shoe.  Problem: RH Simple Meal Prep Goal: LTG Patient will perform simple meal prep w/assist (OT) LTG: Patient will perform simple meal prep with assistance, with/without cues (OT).  Outcome: Not Met (add Reason) Not attempted

## 2012-06-05 NOTE — Progress Notes (Signed)
Patient ID: Dustin West, male   DOB: Dec 15, 1945, 67 y.o.   MRN: [id number]  Subjective/Complaints: No shoulder or thumb pain excited about D/C planned for am A 12 point review of systems has been performed and if not noted above is otherwise negative.  Objective: Vital Signs: Blood pressure 119/70, pulse 76, temperature 98.2 F (36.8 C), temperature source Oral, resp. rate 18, height 5\' 6"  (1.676 m), weight 59.575 kg (131 lb 5.4 oz), SpO2 98.00%. No results found. No results found for this basename: WBC:2,HGB:2,HCT:2,PLT:2 in the last 72 hours No results found for this basename: NA:2,K:2,CL:2,CO:2,GLUCOSE:2,BUN:2,CREATININE:2,CALCIUM:2 in the last 72 hours CBG (last 3)  No results found for this basename: GLUCAP:3 in the last 72 hours  Wt Readings from Last 3 Encounters:  05/28/12 59.575 kg (131 lb 5.4 oz)  05/23/12 70.761 kg (156 lb)    Physical Exam:  Constitutional: She is oriented to person, place, and time. She appears well-developed and well-nourished.  HENT:  Head: Normocephalic and atraumatic.  Right Ear: External ear normal.  Left Ear: External ear normal.  Nose: Nose normal.  Mouth/Throat: Oropharynx is clear and moist.  Eyes: Conjunctivae normal and EOM are normal. Pupils are equal, round, and reactive to light.  Neck: Normal range of motion. Neck supple. No JVD present. No tracheal deviation present. No thyromegaly present.  Cardiovascular: Normal rate, regular rhythm and normal heart sounds. Exam reveals no gallop and no friction rub.  No murmur heard.  Respiratory: Breath sounds normal. GI: Soft. Bowel sounds are normal. She exhibits no distension. There is no tenderness.  Musculoskeletal:R thumb with PIP tenderness and reduced ROM,no directional pain, no erythema, no numbness, mild 1st MCP tenderness Lymphadenopathy:  She has no cervical adenopathy.  Neurological: She is alert and oriented to person, place, and time.  Low volume, somewhat dysarthric speech.  Reasonable insight and awaeness.   Mild left facial weakness. 1/2 sensation left face, arm, leg. Left deltoid, bicep, tricep--1 to 1+, absent wrist and hand, HF 2+, KE and KF - 3, absent ADF and APF. DTR's 2+ right, trace on left. cognitvely he's intact.  Skin: Skin is warm and dry.  Psychiatric: She has a normal mood and affect. behavior is normal. Judgment and thought content normal.    Assessment/Plan: 1. 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 Physiatrist is providing close team supervision and 24 hour management of active medical problems listed below. Physiatrist and rehab team continue to assess barriers to discharge/monitor patient progress toward functional and medical goals. FIM: FIM - Bathing Bathing Steps Patient Completed: Chest;Right Arm;Abdomen;Front perineal area;Buttocks;Right upper leg;Left upper leg;Right lower leg (including foot);Left lower leg (including foot) Bathing: 4: Min-Patient completes 8-9 25f 10 parts or 75+ percent  FIM - Upper Body Dressing/Undressing Upper body dressing/undressing steps patient completed: Thread/unthread right sleeve of pullover shirt/dresss;Thread/unthread left sleeve of pullover shirt/dress;Put head through opening of pull over shirt/dress;Pull shirt over trunk Upper body dressing/undressing: 5: Supervision: Safety issues/verbal cues FIM - Lower Body Dressing/Undressing Lower body dressing/undressing steps patient completed: Thread/unthread right underwear leg;Thread/unthread left underwear leg;Pull underwear up/down;Thread/unthread right pants leg;Thread/unthread left pants leg;Pull pants up/down;Don/Doff right sock;Don/Doff left sock;Don/Doff right shoe Lower body dressing/undressing: 4: Min-Patient completed 75 plus % of tasks  FIM - Toileting Toileting steps completed by patient: Adjust clothing  prior to toileting;Performs perineal hygiene;Adjust clothing after toileting Toileting Assistive Devices: Grab bar or rail for support Toileting: 5:  Supervision: Safety issues/verbal cues  FIM - Archivist Transfers Assistive Devices: Grab bars Toilet Transfers: 5-From toilet/BSC: Supervision (verbal cues/safety issues);5-To toilet/BSC: Supervision (verbal cues/safety issues)  FIM - Banker Devices: Walker;Orthosis;Arm rests (L hand orthosis) Bed/Chair Transfer: 6: Supine > Sit: No assist;6: Sit > Supine: No assist;5: Bed > Chair or W/C: Supervision (verbal cues/safety issues);5: Chair or W/C > Bed: Supervision (verbal cues/safety issues)  FIM - Locomotion: Wheelchair Distance: 0 Locomotion: Wheelchair: 0: Activity did not occur FIM - Locomotion: Ambulation Locomotion: Ambulation Assistive Devices: Designer, industrial/product Ambulation/Gait Assistance: 4: Min guard Locomotion: Ambulation: 4: Travels 150 ft or more with minimal assistance (Pt.>75%)  Comprehension Comprehension Mode: Auditory Comprehension: 6-Follows complex conversation/direction: With extra time/assistive device  Expression Expression Mode: Verbal Expression Assistive Devices: 6-Communication board Expression: 5-Expresses basic needs/ideas: With extra time/assistive device  Social Interaction Social Interaction Mode: Asleep Social Interaction: 6-Interacts appropriately with others with medication or extra time (anti-anxiety, antidepressant).  Problem Solving Problem Solving Mode: Not assessed Problem Solving: 5-Solves basic problems: With no assist  Memory Memory Mode: Not assessed Memory: 5-Requires cues to use assistive device  Medical Problem List and Plan:  1. DVT Prophylaxis/Anticoagulation: Pharmaceutical: Lovenox  2. Pain Management: Xray negative, pain is likely soft tissue, possibly volar plate 3. Mood: team providing positive reinforcement and  encouragement 4. HTN: Recent diagnosis, BPs low d/c cozaar. Monitoring with bid checks.   Renal status nl.  - Education regarding BP control in the context of stroke secondary prevention.  5. Dyslipidemia: continue zocor for management 6.  R thumb pain, probable tendinitis, trial voltaren gel.  Will need to keep thumb spica on for 7 days LOS (Days) 12 A FACE TO FACE EVALUATION WAS PERFORMED  Erick Colace 06/04/2012 9:28 AM

## 2012-06-05 NOTE — Progress Notes (Signed)
Occupational Therapy Session Note  Patient Details  Name: Dustin West MRN: [id number] Date of Birth: 07-04-1945  Today's Date: 06/05/2012 Time: 0909-1000 Time Calculation (min): 51 min   Skilled Therapeutic Interventions/Progress Updates:    Bathing and dressing session sit to stand.  Pt able to ambulate to the shower with the RW and close supervision.  Pt able to perform all bathing except for the RUE, which he needs assistance with for holding the washcloth in the left hand and washing  the right arm.  Gave pt mod instructional cueing to slow down and to be sure to position the LUE in his lap and place the LLE closer under him prior to sit to stand.  Pt needs mod instructional cueing to attempt incorporating the LUE into the task.  Still needs assistance for donning the left AFO and shoe but feel he could be more independent with this if he had a slightly larger shoe and one with velcro.    Therapy Documentation Precautions:  Precautions Precautions: Fall Precaution Comments: left knee buckles  Required Braces or Orthoses: Other Brace/Splint Other Brace/Splint: R hand thumb immobilizer for R thumb DIP pain; L toe-off brace being trialed Restrictions Weight Bearing Restrictions: No  Pain: Pain Assessment Pain Assessment: No/denies pain ADL: See FIM for current functional status  Therapy/Group: Individual Therapy  Kelsy Polack OTR/L 06/05/2012, 10:00 AM

## 2012-06-05 NOTE — Progress Notes (Signed)
Recreational Therapy Session Note  Patient Details  Name: Dustin West MRN: [id number] Date of Birth: [birth year] Today's Date: 06/05/2012 Time:1345-1530 Pain: no c/o Skilled Therapeutic Interventions/Progress Updates: Pt participated in community reintegration/outing ambulatory level with RW with supervision-min assist, min cues for safety.  Pt experienced LOB during outing when taking RUE off of RW during quick movement needing assist for recovery. Session focused on community ambulation on even/uneven indoor/outdoor surfaces, identification & negotiation of obstacles, education on energy conservation techniques, use of LUE, accessing public restroom.  Therapy/Group: [employer]   Maili Shutters 06/05/2012, 4:12 PM

## 2012-06-05 NOTE — Progress Notes (Signed)
Recreational Therapy Discharge Summary Patient Details  Name: Dustin West MRN: [id number] Date of Birth: Feb 02, 1946 Today's Date: 06/05/2012  Long term goals set: 2  Long term goals met: 2  Comments on progress toward goals: Pt has made good progress toward goals and is ready for discharge home tomorrow after family educationwith family.  Family is to provide 24 hour supervision/assistance.  Pt requires extra time, use of AE and occasional verbal cues to complete tasks safely.  Pt is supervision level for simple TR tasks, min assist for community reintegration, min assist for balance with more dynamic tasks. Reasons for discharge: discharge from hospitalPatient/family agrees with progress made and goals achieved: Yes  Ceciley Buist 06/05/2012, 4:25 PM

## 2012-06-06 ENCOUNTER — Inpatient Hospital Stay (HOSPITAL_COMMUNITY): Payer: Medicare Other | Admitting: Speech Pathology

## 2012-06-06 ENCOUNTER — Encounter (HOSPITAL_COMMUNITY): Payer: Medicare Other | Admitting: *Deleted

## 2012-06-06 ENCOUNTER — Inpatient Hospital Stay (HOSPITAL_COMMUNITY): Payer: Medicare Other

## 2012-06-06 MED ORDER — SIMVASTATIN 20 MG PO TABS: 20.0000 mg | ORAL_TABLET | Freq: Every day | ORAL | Status: AC

## 2012-06-06 MED ORDER — CLOPIDOGREL BISULFATE 75 MG PO TABS: 75.0000 mg | ORAL_TABLET | Freq: Every day | ORAL | Status: AC

## 2012-06-06 MED ORDER — HYDROCHLOROTHIAZIDE 12.5 MG PO CAPS: 12.5000 mg | ORAL_CAPSULE | Freq: Every day | ORAL | Status: AC

## 2012-06-06 NOTE — Progress Notes (Signed)
Occupational Therapy Note  Patient Details  Name: Dustin West MRN: [id number] Date of Birth: 05-31-1945 Today's Date: 06/06/2012 Time:  [phone]  (45 min) Pain:  None Individual session  Engaged in patient family education with emphasis on LUE neuro education.  Provided handouts for exerices to be done two times a day.  Daughter/Son observed and voiced understanding.  Went over hemi dressing techniques with son returning demonstration.  Discussed follow up home health.    Humberto Seals 06/06/2012, 10:55 AM

## 2012-06-06 NOTE — Progress Notes (Signed)
Physical Therapy Discharge Summary  Patient Details  Name: Dustin West MRN: [id number] Date of Birth: 08-26-1945  Today's Date: 06/06/2012 Time: 8841-6606 Time Calculation (min): 25 min  Treatment focused on patient/famiy education. Patient performed car transfer with supervision and cueing to slow down. Patient ambulated up and down 12 stairs using wall for balance with supervision with therapist. Patient ambulated up and down 12 steps with son with one loss of balance requiring son's min assist to regain. Patient performed floor transfer with supervision. Patient ambulated greater than 150 feet with rolling walker and supervision. Discussed with family and patient need to slow down and attend to left during ambulation. Discussed staying active at home, need for supervision when up walking, need to remove throw rugs and other tripping hazards, etc for discharge home. Patient, son and daughter report they have no further questions regarding discharge.   Patient has met 8 of 8 long term goals due to improved activity tolerance, improved balance, improved postural control and functional use of  left upper extremity and left lower extremity.  Patient to discharge at an ambulatory level Supervision.   Patient's care partner is independent to provide the necessary physical assistance at discharge.  Reasons goals not met: n/a all goals met  Recommendation:  Patient will benefit from ongoing skilled PT services in home health setting to continue to advance safe functional mobility and minimize fall risk.  Equipment: rolling walker, walker splint, left AFO  Reasons for discharge: treatment goals met and discharge from hospital  Patient/family agrees with progress made and goals achieved: Yes  PT Discharge Precautions/Restrictions Precautions Precautions: Fall Required Braces or Orthoses: Other Brace/Splint Other Brace/Splint: Right thumb splint; left toe of brace Restrictions Weight  Bearing Restrictions: No Pain Pain Assessment Pain Assessment: No/denies pain Vision/Perception  Vision - History Baseline Vision: Bifocals Vision - Assessment Eye Alignment: Within Functional Limits  Cognition Overall Cognitive Status: Appears within functional limits for tasks assessed Orientation Level: Oriented X4 Motor  Motor Motor: Hemiplegia  Mobility Bed Mobility Rolling Right: 7: Independent Rolling Left: 7: Independent Right Sidelying to Sit: 7: Independent Left Sidelying to Sit: 6: Modified independent (Device/Increase time) Supine to Sit: 6: Modified independent (Device/Increase time) Sitting - Scoot to Edge of Bed: 7: Independent Sit to Supine: 6: Modified independent (Device/Increase time) Scooting to [facility]: 6: Modified independent (Device/Increase time) Transfers Sit to Stand: 6: Modified independent (Device/Increase time) Stand to Sit: 6: Modified independent (Device/Increase time) Locomotion  Ambulation Ambulation/Gait Assistance: 5: Supervision Ambulation Distance (Feet): 250 Feet Assistive device: Rolling walker Ambulation/Gait Assistance Details: Patient wearing L toe off orthosis. Patient with improved L foot clearance and placement. Patient did have incident on community outing where he let go of walker and had LOB requiring assistance to regain. Gait Gait: Yes Gait Pattern: Impaired Gait Pattern: Step-through pattern;Narrow base of support;Left genu recurvatum;Trunk rotated posteriorly on left;Decreased hip/knee flexion - left;Decreased dorsiflexion - left Stairs / Additional Locomotion Stairs: Yes Stairs Assistance: 4: Min guard Stairs Assistance Details (indicate cue type and reason): Patient reports no rails; patient uses wall to balance going up and down stairs. Patient performed with supervision with therapist. Patient had 1 LOB requiring min assist from son ascending steps. Stair Management Technique: Step to pattern;Forwards;Other (comment) (uses  wall on right for balance like home setting) Number of Stairs: 12  Height of Stairs: 6    See FIM for current functional status  [name] 06/06/2012, 3:20 PM

## 2012-06-06 NOTE — Progress Notes (Signed)
Speech Language Pathology Daily Session Note & Discharge Summary  Patient Details  Name: KORTNEY SCHOENFELDER MRN: [id number] Date of Birth: 1945-11-21  Today's Date: 06/06/2012 Time: 2956-2130 Time Calculation (min): 10 min  Short Term Goals: Week 2: SLP Short Term Goal 1 (Week 2): Patient will utilize speech intelligibility strategies during conversation at the sentence-conversational level with supervision level verbal/non-verbal cues SLP Short Term Goal 2 (Week 2): Patient will self monitor and correct errors as well as commuication environment with supervision level verbal cues.  Skilled Therapeutic Interventions: Skilled treatment session focused on completing patient and family education.  SLP facilitated session by educating regarding continuation of oral motor and diaphragmatic breathing exercises, showed family handouts and exercises patient has been working on, as well as compensatory strategies.  Patient demonstrated use of slow speech with adequate pacing and was therefore intelligible throughout session.  SLP recommended family remind patient to utilize strategies and be honest about not understanding him for feedback when they get home because follow up SLP services are not recommended.   Education has been completed and patient can produce intelligible speech with use of strategies.     FIM:  Comprehension Comprehension Mode: Auditory Comprehension: 6-Follows complex conversation/direction: With extra time/assistive device Expression Expression Mode: Verbal Expression: 5-Expresses basic needs/ideas: With extra time/assistive device Social Interaction Social Interaction: 6-Interacts appropriately with others with medication or extra time (anti-anxiety, antidepressant). Problem Solving Problem Solving: 5-Solves basic problems: With no assist Memory Memory: 5-Recognizes or recalls 90% of the time/requires cueing < 10% of the time  Pain Pain Assessment Pain Assessment:  No/denies pain Pain Score: 0-No pain  Therapy/Group: Individual Therapy   Speech Language Pathology Discharge Summary  Patient Details  Name: REGINALD WEIDA MRN: [id number] Date of Birth: 12-Nov-1945  Today's Date: 06/06/2012  Patient has met 1 of 1 long term goals.  Patient to discharge at overall Modified Independent;Supervision level.  Reasons goals not met: n/a   Clinical Impression/Discharge Summary: Patient met 1 out of 1 long term goals due to functional gains in speech intelligibility.  Patient progress from mod assist to Mod I-supervision assist to demonstrate use of slow speech with adequate pacing, which results in intelligible speech.   SLP recommended family remind patient to utilize strategies and be honest about not understanding him for feedback when they get home because follow up SLP services are not recommended.   Education has been completed and patient can produce intelligible speech with use of strategies.    Care Partner:  Caregiver Able to Provide Assistance: Yes  Type of Caregiver Assistance:  (Self monitoring and correcting speech intelligibility)  Recommendation:  None;24 hour supervision/assistance     Equipment: none   Reasons for discharge: Treatment goals met;Discharged from hospital   Patient/Family Agrees with Progress Made and Goals Achieved: Yes   See FIM for current functional status  Charlane Ferretti., CCC-SLP [phone]  Jhon Mallozzi 06/06/2012, 11:55 AM

## 2012-06-06 NOTE — Progress Notes (Signed)
Social Work Discharge Note Discharge Note  The overall goal for the admission was met for:   Discharge location: Yes-HOME WITH SON MOVING IN WITH HIM TO PROVIDE CARE  Length of Stay: Yes-14 DAYS  Discharge activity level: Yes-SUPERVISION LEVEL  Home/community participation: Yes  Services provided included: MD, RD, PT, OT, SLP, RN, Pharmacy and SW  Financial Services: Medicare and Other: [employer]  Follow-up services arranged: Home Health: [facility]-PT,OT, RN, DME: [facility], [facility] and Patient/Family has no preference for HH/DME agencies  Comments (or additional information):FAMILY EDUCATION DAY OF DISCHARGE, ALL COMFORTABLE WITH CARE.  AWARE PT NEEDS 24 HR SUPERVISION  Patient/Family verbalized understanding of follow-up arrangements: Yes  Individual responsible for coordination of the follow-up plan: CONSWELLA-DAUGHTER AND SON  Confirmed correct DME delivered: Lucy Chris 06/06/2012    Lucy Chris

## 2012-06-06 NOTE — Progress Notes (Signed)
Patient ID: Dustin West, male   DOB: [birth year], 67 y.o.   MRN: [id number]  Subjective/Complaints: No new issues Pt is exceited about D/C A 12 point review of systems has been performed and if not noted above is otherwise negative.  Objective: Vital Signs: Blood pressure 119/70, pulse 76, temperature 98.2 F (36.8 C), temperature source Oral, resp. rate 18, height 5\' 6"  (1.676 m), weight 59.575 kg (131 lb 5.4 oz), SpO2 98.00%. No results found. No results found for this basename: WBC:2,HGB:2,HCT:2,PLT:2 in the last 72 hours No results found for this basename: NA:2,K:2,CL:2,CO:2,GLUCOSE:2,BUN:2,CREATININE:2,CALCIUM:2 in the last 72 hours CBG (last 3)  No results found for this basename: GLUCAP:3 in the last 72 hours  Wt Readings from Last 3 Encounters:  05/28/12 59.575 kg (131 lb 5.4 oz)  05/23/12 70.761 kg (156 lb)    Physical Exam:  Constitutional: She is oriented to person, place, and time. She appears well-developed and well-nourished.  HENT:  Head: Normocephalic and atraumatic.  Right Ear: External ear normal.  Left Ear: External ear normal.  Nose: Nose normal.  Mouth/Throat: Oropharynx is clear and moist.  Eyes: Conjunctivae normal and EOM are normal. Pupils are equal, round, and reactive to light.  Neck: Normal range of motion. Neck supple. No JVD present. No tracheal deviation present. No thyromegaly present.  Cardiovascular: Normal rate, regular rhythm and normal heart sounds. Exam reveals no gallop and no friction rub.  No murmur heard.  Respiratory: Breath sounds normal. GI: Soft. Bowel sounds are normal. She exhibits no distension. There is no tenderness.  Musculoskeletal:R thumb with PIP tenderness and reduced ROM,no directional pain, no erythema, no numbness,  Lymphadenopathy:  She has no cervical adenopathy.  Neurological: She is alert and oriented to person, place, and time.  Low volume, somewhat dysarthric speech. Reasonable insight and awaeness.   Mild left  facial weakness. 1/2 sensation left face, arm, leg. Left deltoid, bicep, tricep--1 to 1+, absent wrist and hand, HF 2+, KE and KF - 3, absent ADF and APF. DTR's 2+ right, trace on left. cognitvely he's intact.  Skin: Skin is warm and dry.  Psychiatric: She has a normal mood and affect. behavior is normal. Judgment and thought content normal.    Assessment/Plan: 1. 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 DVT Prophylaxis/Anticoagulation: Pharmaceutical: Lovenox  2. Pain Management: Xray negative, pain is likely soft tissue, possibly volar plate 3. Mood: team providing positive reinforcement and encouragement 4. HTN: Recent diagnosis, BPs low d/c cozaar. Monitoring with bid checks.   Renal status nl.  - Education regarding BP control in the context of stroke secondary prevention.  5. Dyslipidemia: continue zocor for management 6.  R thumb pain, probable tendinitis, trial voltaren gel.  Will need to keep thumb spica on for 7 more  Days, see me in office for f/u cont voltaren LOS (Days) 12 A FACE TO FACE EVALUATION WAS PERFORMED  Erick Colace 06/04/2012 9:28 AM

## 2012-06-06 NOTE — Progress Notes (Signed)
Patient and family given discharge instructions by Marissa Nestle, PA.  All questions answered.  Patient escorted via wheelchair to vehicle by [place], NT.  Patient discharged home with family.

## 2012-06-09 NOTE — Progress Notes (Signed)
discharge summary # [id number]

## 2012-06-10 NOTE — Discharge Summary (Signed)
NAMECORAL, SOLER NO.:  [id number]  MEDICAL RECORD NO.:  [id number]  LOCATION:  4149                         FACILITY:  [facility]  PHYSICIAN:  Erick Colace, M.D.DATE OF BIRTH:  Nov 26, 1945  DATE OF ADMISSION:  05/23/2012 DATE OF DISCHARGE:  06/06/2012                              DISCHARGE SUMMARY   DISCHARGE DIAGNOSES: 1. Thrombotic right pontine infarct. 2. Hypertension. 3. Dyslipidemia. 4. Right thumb pain likely due to tendinitis  HISTORY OF PRESENT ILLNESS:  Mr. Dustin West is a 67 year old right- handed male with history of hypertension, TIA event December 25th who was readmitted to [facility] on May 20, 2012, with left- sided weakness and numbness.  Echo done on prior visit had revealed EF of 60% to 65% with mild LVH.  MRI of head done revealed 1.2 cm acute right pontine infarct.  MRA of head and neck showed no stenosis.  The patient reported problems with cough due to lisinopril, therefore, was changed over to Cozaar for blood pressure management.  He was placed on Plavix for thrombotic stroke.  Therapies were initiated and the patient continued to be impaired in ability to carry out ADLs as well as deficits in balance and mobility.  Therapy team working on pregait activity and recommended intensive therapies.  The patient admitted to [facility] on May 23, 2012, for progressive therapy.  PAST MEDICAL HISTORY:  Recent diagnosis of hypertension and stroke, history of prostate cancer and hepatitis A.  FUNCTIONAL HISTORY:  The patient lives alone.  Used to work as a Music therapist for [employer].  FUNCTIONAL STATUS:  The patient was requiring min assist for sitting balance, min assist for transfers with verbal cues.  Able to stand 1-2 minutes with mild posterior lean.  He required min assist for feeding past setup.  Required max assist for upper and lower body ADLs.  LAB RESULTS:  Check of lytes revealed sodium 134, potassium  3.9, chloride 97, CO2 of 28, BUN 21, creatinine 1.07, and glucose 101.  CBC revealed hemoglobin 14.2, hematocrit 42.6, white count 5.0, platelets 219.  RADIOLOGY REPORT:  X-rays of right thumb showed no acute fracture or dislocation.  HOSPITAL COURSE:  Mr. Dustin West was admitted to Rehab on May 23, 2012, for inpatient therapies to consist of PT, OT, and speech therapy at least 3 hours 5 days a week.  Past admission, physiatrist, rehab, RN, and therapy team have worked together to provide customized collaborative interdisciplinary care.  Rehab RN has worked with the patient on bowel and bladder program as well as safety plan.  The patient's blood pressures were checked on b.i.d. basis and were noted to be reasonably controlled.  P.o. intake has been good.  The patient has been continent of bowel and bladder.  He did report complaints of right thumb pain.  X-rays done were negative for fracture and dislocation.  He was treated with a spica splint for tendinitis symptoms.  During the patient's stay in rehab, weekly team conferences were held to monitor the patient's progress, set goals, as well as discuss barriers to discharge.  OT has worked with the patient on self-care tasks as well as neuro re-education of left upper extremity.  By  the time of discharge, the patient was showing improvement in awareness and attention to his left upper extremity.  He was at min assist overall for bathing and dressing tasks.  Physical Therapy has worked with the patient on balance, mobility, as well as strengthening.  The patient was at supervision level for transfers and mobility.  He was able to walk greater than 150 feet with rolling walker and AFO.  He was able to navigate 12 steps with supervision.  Family education was done with especially needs to provide the patient cuing to slow down as well as attempt left during ambulation for safety.  Speech Therapy has worked with patient on  utilization of intelligibility strategies for conversation at sentence level.  The patient has required encouragement to participate in therapy.  He was educated on use of slow speech with adequate pacing to help with speech intelligibility.  Family was educated on reminding the patient to utilize strategies as well as reminding the patient to continue oral, motor, and diaphragmatic breathing exercises.  The patient was at modified independent level to supervision for using the strategies.  No further followup speech therapy is recommended past discharge.  On June 07, 2011, the patient was discharged to home in improved condition.  DISCHARGE MEDICATIONS: 1. Plavix 75 mg p.o. per day. 2. Microzide 12.5 mg p.o. per day. 3. Zocor 20 mg p.o. per day.  DIET:  Low fat, low cholesterol.  ACTIVITY LEVEL:  As tolerated with supervision.  SPECIAL INSTRUCTIONS:  No driving.  24-hour supervision.  Home Health PT, OT, and RN by [facility].  FOLLOWUP:  The patient to follow up with Dr. Wynn Banker on June 26, 2012, at 10:15 a.m. Follow up with Dr. Kirstie Peri next week.     Delle Reining, P.A.   ______________________________ Erick Colace, M.D.    PL/MEDQ  D:  06/09/2012  T:  06/09/2012  Job:  [id number]  cc:   Kirstie Peri, MD

## 2012-06-26 ENCOUNTER — Telehealth: Payer: Self-pay

## 2012-06-26 ENCOUNTER — Inpatient Hospital Stay: Payer: Medicare Other | Admitting: Physical Medicine & Rehabilitation

## 2012-06-26 NOTE — Telephone Encounter (Signed)
Dustin West with [facility] needs order to continue care for a week.  Verbal order given.

## 2014-11-15 IMAGING — CR DG FINGER THUMB 2+V*R*
3 series · 3 of 3 positions shown · non-contrast
Comparison: None.

CLINICAL DATA: Fall

RIGHT THUMB 2+V

[x finger pa right]
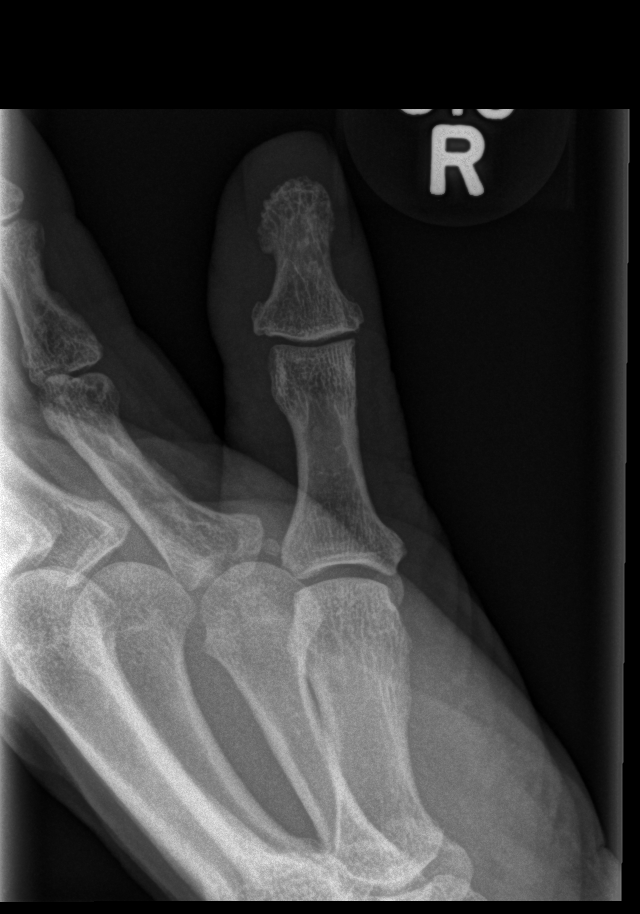

[x finger obl right]
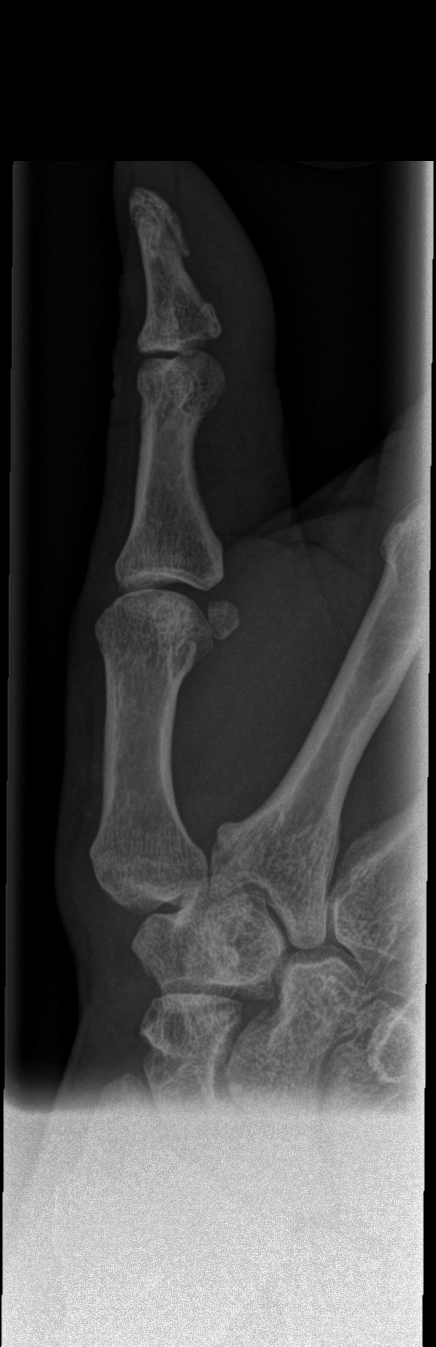

[x finger lat right]
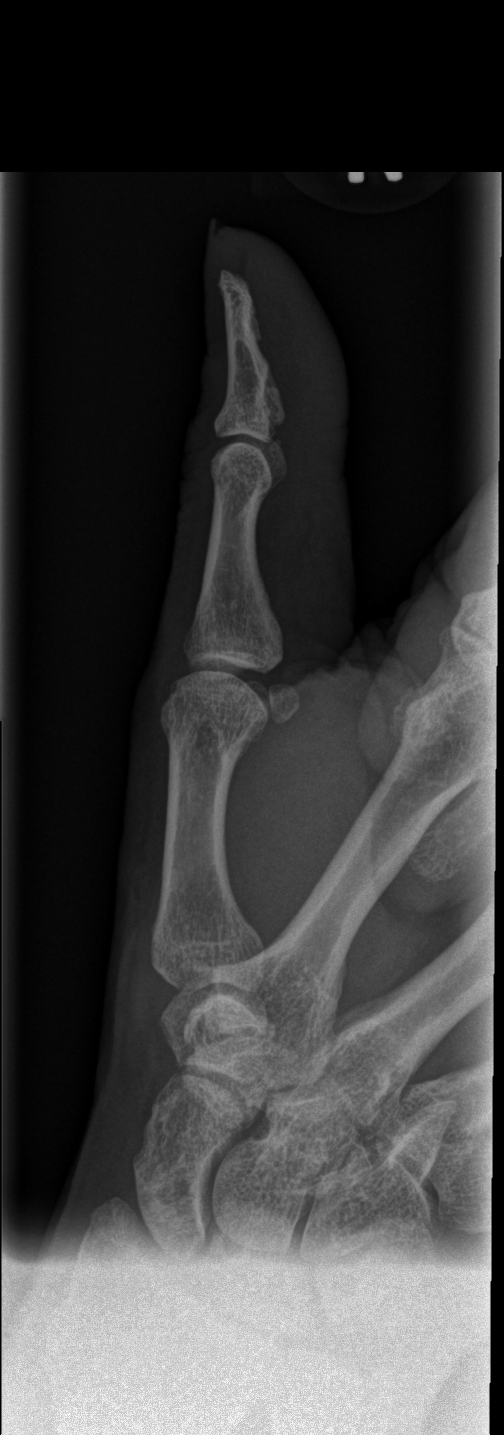

[3 of 3 positions shown; findings below may reference images not displayed]

FINDINGS: No acute fracture and no dislocation.
IMPRESSION: No acute bony pathology.

## 2022-01-30 ENCOUNTER — Other Ambulatory Visit: Payer: Self-pay | Admitting: *Deleted

## 2022-01-30 DIAGNOSIS — I739 Peripheral vascular disease, unspecified: Secondary | ICD-10-CM

## 2022-02-07 ENCOUNTER — Ambulatory Visit (INDEPENDENT_AMBULATORY_CARE_PROVIDER_SITE_OTHER): Payer: Medicare Other

## 2022-02-07 ENCOUNTER — Encounter: Payer: Self-pay | Admitting: Vascular Surgery

## 2022-02-07 ENCOUNTER — Ambulatory Visit (INDEPENDENT_AMBULATORY_CARE_PROVIDER_SITE_OTHER): Payer: Medicare Other | Admitting: Vascular Surgery

## 2022-02-07 VITALS — BP 136/85 | HR 70 | Temp 98.1°F | Resp 18 | Ht 66.0 in | Wt 143.0 lb

## 2022-02-07 DIAGNOSIS — M25561 Pain in right knee: Secondary | ICD-10-CM

## 2022-02-07 DIAGNOSIS — M25562 Pain in left knee: Secondary | ICD-10-CM

## 2022-02-07 DIAGNOSIS — I739 Peripheral vascular disease, unspecified: Secondary | ICD-10-CM | POA: Diagnosis not present

## 2022-02-07 NOTE — Progress Notes (Signed)
[facility]ist [facility] [place]  Patient name: Dustin West MRN: [id number] DOB: Jun 24, 1945 Sex: male  REASON FOR CONSULT: Evaluation of lower extremity discomfort  HPI: Dustin West is a 76 y.o. male, who is here today for evaluation of lower extremity.  He is here today with his daughter.  He reports that his main complaint is of left-sided weakness related to prior strokes.  He does report some mild discomfort in both lower extremities.  He does not walk to the level of claudication and does not have arterial rest pain at night.  Past Medical History:  Diagnosis Date   Cancer ([place])    Hepatitis A    Hypertension    diagnosed 05/14/12   Stroke [facility])     Family History  Problem Relation Age of Onset   Cancer Father    Cancer Brother     SOCIAL HISTORY: Social History   Socioeconomic History   Marital status: Single    Spouse name: Not on file   Number of children: Not on file   Years of education: Not on file   Highest education level: Not on file  Occupational History   Not on file  Tobacco Use   Smoking status: Former    Packs/day: 2.00    Years: 40.00    Total pack years: 80.00    Types: Cigarettes    Quit date: 05/21/1997    Years since quitting: 24.7   Smokeless tobacco: Not on file  Substance and Sexual Activity   Alcohol use: Not on file   Drug use: Not on file   Sexual activity: Not on file  Other Topics Concern   Not on file  Social History Narrative   Not on file   Social Determinants of Health   Financial Resource Strain: Not on file  Food Insecurity: Not on file  Transportation Needs: Not on file  Physical Activity: Not on file  Stress: Not on file  Social Connections: Not on file  Intimate Partner Violence: Not on file    Allergies  Allergen Reactions   Lisinopril Cough    Current Outpatient Medications  Medication Sig Dispense Refill   aspirin EC 81 MG tablet Take by mouth.      atorvastatin (LIPITOR) 20 MG tablet Take 20 mg by mouth daily.     clopidogrel (PLAVIX) 75 MG tablet Take 1 tablet (75 mg total) by mouth daily with breakfast. 30 tablet 1   famotidine (PEPCID) 20 MG tablet Take 20 mg by mouth daily.     hydrochlorothiazide (MICROZIDE) 12.5 MG capsule Take 1 capsule (12.5 mg total) by mouth daily. 30 capsule 1   simvastatin (ZOCOR) 20 MG tablet Take 1 tablet (20 mg total) by mouth daily at 6 PM. 30 tablet 1   No current facility-administered medications for this visit.    REVIEW OF SYSTEMS:  '[X]'$  denotes positive finding, '[ ]'$  denotes negative finding Cardiac  Comments:  Chest pain or chest pressure:    Shortness of breath upon exertion: x   Short of breath when lying flat: x   Irregular heart rhythm: x       Vascular    Pain in calf, thigh, or hip brought on by ambulation: x   Pain in feet at night that wakes you up from your sleep:     Blood clot in your veins:    Leg swelling:         Pulmonary    Oxygen at  home:    Productive cough:  x   Wheezing:         Neurologic    Sudden weakness in arms or legs:  x   Sudden numbness in arms or legs:     Sudden onset of difficulty speaking or slurred speech:    Temporary loss of vision in one eye:     Problems with dizziness:         Gastrointestinal    Blood in stool:     Vomited blood:         Genitourinary    Burning when urinating:     Blood in urine:        Psychiatric    Major depression:         Hematologic    Bleeding problems:    Problems with blood clotting too easily:        Skin    Rashes or ulcers:        Constitutional    Fever or chills:      PHYSICAL EXAM: Vitals:   02/07/22 1026  BP: 136/85  Pulse: 70  Resp: 18  Temp: 98.1 F (36.7 C)  TempSrc: Temporal  SpO2: 95%  Weight: 143 lb (64.9 kg)  Height: '5\' 6"'$  (1.676 m)    GENERAL: The patient is a well-nourished male, in no acute distress. The vital signs are documented above. CARDIOVASCULAR: 2+ radial  pulses bilaterally.  2+ popliteal pulses bilaterally.  1-2+ dorsalis pedis pulses bilaterally. PULMONARY: There is good air exchange  MUSCULOSKELETAL: There are no major deformities or cyanosis. NEUROLOGIC: No focal weakness or paresthesias are detected. SKIN: There are no ulcers or rashes noted. PSYCHIATRIC: The patient has a normal affect.  DATA:  Noninvasive studies in our office today revealed normal ankle arm index bilaterally normal triphasic waveforms  MEDICAL ISSUES: Discussed these findings with the patient and his daughter present.  Do not see any evidence of arterial insufficiency.  He was reassured with this discussion and will see [place] again in an as-needed basis   Rosetta Posner, MD [facility] [phone] Pager [phone]  Note: Portions of this report may have been transcribed using voice recognition software.  Every effort has been made to ensure accuracy; however, inadvertent computerized transcription errors may still be present.

## 2022-02-13 DIAGNOSIS — I10 Essential (primary) hypertension: Secondary | ICD-10-CM | POA: Diagnosis not present

## 2022-02-13 DIAGNOSIS — Z8673 Personal history of transient ischemic attack (TIA), and cerebral infarction without residual deficits: Secondary | ICD-10-CM | POA: Diagnosis not present

## 2022-02-13 DIAGNOSIS — Z6824 Body mass index (BMI) 24.0-24.9, adult: Secondary | ICD-10-CM | POA: Diagnosis not present

## 2022-02-13 DIAGNOSIS — Z0000 Encounter for general adult medical examination without abnormal findings: Secondary | ICD-10-CM | POA: Diagnosis not present

## 2022-02-13 DIAGNOSIS — M79642 Pain in left hand: Secondary | ICD-10-CM | POA: Diagnosis not present

## 2022-02-13 DIAGNOSIS — K219 Gastro-esophageal reflux disease without esophagitis: Secondary | ICD-10-CM | POA: Diagnosis not present

## 2022-02-13 DIAGNOSIS — R52 Pain, unspecified: Secondary | ICD-10-CM | POA: Diagnosis not present

## 2022-02-13 DIAGNOSIS — E785 Hyperlipidemia, unspecified: Secondary | ICD-10-CM | POA: Diagnosis not present

## 2022-05-22 DIAGNOSIS — Z6823 Body mass index (BMI) 23.0-23.9, adult: Secondary | ICD-10-CM | POA: Diagnosis not present

## 2022-05-22 DIAGNOSIS — Z8673 Personal history of transient ischemic attack (TIA), and cerebral infarction without residual deficits: Secondary | ICD-10-CM | POA: Diagnosis not present

## 2022-05-22 DIAGNOSIS — K219 Gastro-esophageal reflux disease without esophagitis: Secondary | ICD-10-CM | POA: Diagnosis not present

## 2022-05-22 DIAGNOSIS — E785 Hyperlipidemia, unspecified: Secondary | ICD-10-CM | POA: Diagnosis not present

## 2022-05-22 DIAGNOSIS — M79642 Pain in left hand: Secondary | ICD-10-CM | POA: Diagnosis not present

## 2022-05-22 DIAGNOSIS — Z0000 Encounter for general adult medical examination without abnormal findings: Secondary | ICD-10-CM | POA: Diagnosis not present

## 2022-05-22 DIAGNOSIS — I10 Essential (primary) hypertension: Secondary | ICD-10-CM | POA: Diagnosis not present

## 2022-05-25 DIAGNOSIS — Z0000 Encounter for general adult medical examination without abnormal findings: Secondary | ICD-10-CM | POA: Diagnosis not present

## 2022-05-25 DIAGNOSIS — R7303 Prediabetes: Secondary | ICD-10-CM | POA: Diagnosis not present

## 2022-05-25 DIAGNOSIS — M79642 Pain in left hand: Secondary | ICD-10-CM | POA: Diagnosis not present

## 2022-05-25 DIAGNOSIS — I10 Essential (primary) hypertension: Secondary | ICD-10-CM | POA: Diagnosis not present

## 2022-05-25 DIAGNOSIS — K219 Gastro-esophageal reflux disease without esophagitis: Secondary | ICD-10-CM | POA: Diagnosis not present

## 2022-05-25 DIAGNOSIS — N1831 Chronic kidney disease, stage 3a: Secondary | ICD-10-CM | POA: Diagnosis not present

## 2022-05-25 DIAGNOSIS — E785 Hyperlipidemia, unspecified: Secondary | ICD-10-CM | POA: Diagnosis not present

## 2022-05-25 DIAGNOSIS — Z8673 Personal history of transient ischemic attack (TIA), and cerebral infarction without residual deficits: Secondary | ICD-10-CM | POA: Diagnosis not present
